# Patient Record
Sex: Female | Born: 1940 | Race: White | Hispanic: No | Marital: Married | Smoking: Never smoker
Health system: Southern US, Community
[De-identification: ages and names within clinical notes are randomized; demographics above are authoritative.]

## PROBLEM LIST (undated history)

## (undated) DIAGNOSIS — R51 Headache: Secondary | ICD-10-CM

## (undated) DIAGNOSIS — E7800 Pure hypercholesterolemia, unspecified: Secondary | ICD-10-CM

## (undated) DIAGNOSIS — I2510 Atherosclerotic heart disease of native coronary artery without angina pectoris: Secondary | ICD-10-CM

## (undated) DIAGNOSIS — S42309A Unspecified fracture of shaft of humerus, unspecified arm, initial encounter for closed fracture: Secondary | ICD-10-CM

## (undated) DIAGNOSIS — E039 Hypothyroidism, unspecified: Secondary | ICD-10-CM

## (undated) DIAGNOSIS — G4700 Insomnia, unspecified: Secondary | ICD-10-CM

## (undated) DIAGNOSIS — M1990 Unspecified osteoarthritis, unspecified site: Secondary | ICD-10-CM

## (undated) DIAGNOSIS — I739 Peripheral vascular disease, unspecified: Secondary | ICD-10-CM

## (undated) DIAGNOSIS — J302 Other seasonal allergic rhinitis: Secondary | ICD-10-CM

## (undated) DIAGNOSIS — R519 Headache, unspecified: Secondary | ICD-10-CM

## (undated) HISTORY — PX: COLONOSCOPY W/ BIOPSIES AND POLYPECTOMY: SHX1376

## (undated) HISTORY — PX: CORONARY ANGIOPLASTY WITH STENT PLACEMENT: SHX49

## (undated) HISTORY — PX: JOINT REPLACEMENT: SHX530

## (undated) HISTORY — PX: MULTIPLE TOOTH EXTRACTIONS: SHX2053

## (undated) HISTORY — PX: TUBAL LIGATION: SHX77

## (undated) HISTORY — PX: TONSILLECTOMY: SUR1361

## (undated) HISTORY — PX: FRACTURE SURGERY: SHX138

## (undated) HISTORY — PX: DILATION AND CURETTAGE OF UTERUS: SHX78

## (undated) HISTORY — PX: MEDIAL PARTIAL KNEE REPLACEMENT: SHX5965

## (undated) HISTORY — PX: APPENDECTOMY: SHX54

## (undated) HISTORY — PX: ABDOMINAL HYSTERECTOMY: SHX81

## (undated) HISTORY — PX: CHOLECYSTECTOMY: SHX55

---

## 1998-10-12 ENCOUNTER — Ambulatory Visit (HOSPITAL_COMMUNITY): Admission: RE | Admit: 1998-10-12 | Discharge: 1998-10-12 | Payer: Self-pay

## 2000-10-17 ENCOUNTER — Ambulatory Visit (HOSPITAL_COMMUNITY): Admission: RE | Admit: 2000-10-17 | Discharge: 2000-10-17 | Payer: Self-pay | Admitting: Internal Medicine

## 2002-10-22 ENCOUNTER — Ambulatory Visit (HOSPITAL_COMMUNITY): Admission: RE | Admit: 2002-10-22 | Discharge: 2002-10-22 | Payer: Self-pay | Admitting: *Deleted

## 2015-07-10 ENCOUNTER — Emergency Department (HOSPITAL_COMMUNITY)
Admission: EM | Admit: 2015-07-10 | Discharge: 2015-07-10 | Disposition: A | Discharge status: Home / Self Care | Payer: Medicare Other | Attending: Emergency Medicine | Admitting: Emergency Medicine

## 2015-07-10 ENCOUNTER — Encounter (HOSPITAL_COMMUNITY): Payer: Self-pay | Admitting: Emergency Medicine

## 2015-07-10 ENCOUNTER — Emergency Department (HOSPITAL_COMMUNITY): Payer: Medicare Other

## 2015-07-10 DIAGNOSIS — M19012 Primary osteoarthritis, left shoulder: Secondary | ICD-10-CM | POA: Diagnosis not present

## 2015-07-10 DIAGNOSIS — Z7982 Long term (current) use of aspirin: Secondary | ICD-10-CM | POA: Diagnosis not present

## 2015-07-10 DIAGNOSIS — Z79899 Other long term (current) drug therapy: Secondary | ICD-10-CM | POA: Diagnosis not present

## 2015-07-10 DIAGNOSIS — Z7902 Long term (current) use of antithrombotics/antiplatelets: Secondary | ICD-10-CM | POA: Insufficient documentation

## 2015-07-10 DIAGNOSIS — Y998 Other external cause status: Secondary | ICD-10-CM | POA: Insufficient documentation

## 2015-07-10 DIAGNOSIS — Z9861 Coronary angioplasty status: Secondary | ICD-10-CM | POA: Diagnosis not present

## 2015-07-10 DIAGNOSIS — Y92014 Private driveway to single-family (private) house as the place of occurrence of the external cause: Secondary | ICD-10-CM | POA: Insufficient documentation

## 2015-07-10 DIAGNOSIS — W010XXA Fall on same level from slipping, tripping and stumbling without subsequent striking against object, initial encounter: Secondary | ICD-10-CM | POA: Diagnosis not present

## 2015-07-10 DIAGNOSIS — S42202A Unspecified fracture of upper end of left humerus, initial encounter for closed fracture: Secondary | ICD-10-CM

## 2015-07-10 DIAGNOSIS — Y9389 Activity, other specified: Secondary | ICD-10-CM | POA: Diagnosis not present

## 2015-07-10 DIAGNOSIS — S42292A Other displaced fracture of upper end of left humerus, initial encounter for closed fracture: Secondary | ICD-10-CM | POA: Diagnosis not present

## 2015-07-10 DIAGNOSIS — S4992XA Unspecified injury of left shoulder and upper arm, initial encounter: Secondary | ICD-10-CM | POA: Diagnosis present

## 2015-07-10 MED ORDER — OXYCODONE-ACETAMINOPHEN 5-325 MG PO TABS
1.0000 | ORAL_TABLET | Freq: Once | ORAL | Status: AC
  Administered 2015-07-10: 1 via ORAL
  Filled 2015-07-10: qty 1

## 2015-07-10 MED ORDER — OXYCODONE-ACETAMINOPHEN 5-325 MG PO TABS: 1.0000 | ORAL_TABLET | Freq: Four times a day (QID) | ORAL | Status: AC | PRN

## 2015-07-10 NOTE — Discharge Instructions (Signed)
Please read and follow all provided instructions.  Your diagnoses today include:  1. Proximal humerus fracture, left, closed, initial encounter     Tests performed today include:  An x-ray of the affected area - shows proximal humerus fracture  Vital signs. See below for your results today.   Medications prescribed:   Percocet (oxycodone/acetaminophen) - narcotic pain medication  DO NOT drive or perform any activities that require you to be awake and alert because this medicine can make you drowsy. BE VERY CAREFUL not to take multiple medicines containing Tylenol (also called acetaminophen). Doing so can lead to an overdose which can damage your liver and cause liver failure and possibly death.  Take any prescribed medications only as directed.  Home care instructions:   Follow any educational materials contained in this packet  Follow R.I.C.E. Protocol:  R - rest your injury   I  - use ice on injury without applying directly to skin  C - compress injury with bandage or splint  E - elevate the injury as much as possible  Follow-up instructions: [facility] Monday morning between 8 AM and 10 AM and Dr. Darrelyn HillockGioffre will see you.  Return instructions:   Please return if your fingers are numb or tingling, appear gray or blue, or you have severe pain (also elevate the arm and loosen splint or wrap if you were given one)  Please return to the Emergency Department if you experience worsening symptoms.   Please return if you have any other emergent concerns.  Additional Information:  Your vital signs today were: BP 104/73 mmHg   Pulse 66   Temp(Src) 98.1 F (36.7 C) (Oral)   Resp 16   SpO2 99% If your blood pressure (BP) was elevated above 135/85 this visit, please have this repeated by your doctor within one month. --------------

## 2015-07-10 NOTE — ED Notes (Signed)
Pa  at bedside. 

## 2015-07-10 NOTE — ED Notes (Signed)
Pt fell yesterday and fell on L arm that is scheduled to have shoulder surgery. No previous breaks.

## 2015-07-10 NOTE — ED Provider Notes (Signed)
CSN: [id number]     Arrival date & time 07/10/15  0912 History   First MD Initiated Contact with Patient 07/10/15 [phone]     Chief Complaint  Patient presents with  . 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 Ranell PatrickNorris -- presents with complaint of acute onset left shoulder pain after falling while at [employer] today. Patient states she reached for her shopping cart and fell forward onto her left arm. Pain is worse with movement and palpation. She did not hurt her head or neck. No treatments prior to arrival. Patient caught her orthopedist and was directed to go to the emergency department for x-rays.  Patient is a 75 y.o. female presenting with arm injury. The history is provided by the patient.  Arm Injury Associated symptoms: no back pain and no neck pain     History reviewed. No pertinent past medical history. Past Surgical History  Procedure Laterality Date  . Coronary angioplasty with stent placement     History reviewed. No pertinent family history. Social History  Substance Use Topics  . Smoking status: Never Smoker   . Smokeless tobacco: None  . Alcohol Use: No   OB History    No data available     Review of Systems  Constitutional: Negative for activity change.  Musculoskeletal: Positive for myalgias and arthralgias. Negative for back pain, joint swelling and neck pain.  Skin: Negative for wound.  Neurological: Negative for weakness and numbness.      Allergies  Review of patient's allergies indicates no known allergies.  Home Medications   Prior to Admission medications   Medication Sig Start Date End Date Taking? Authorizing Provider  aspirin EC 81 MG tablet Take 81 mg by mouth daily before lunch.   Yes Historical Provider, MD  atorvastatin (LIPITOR) 40 MG tablet Take 40 mg by mouth at  bedtime.   Yes Historical Provider, MD  cetirizine (ZYRTEC) 10 MG tablet Take 10 mg by mouth daily. 01/10/10  Yes Historical Provider, MD  clopidogrel (PLAVIX) 75 MG tablet Take 75 mg by mouth daily. 01/10/10  Yes Historical Provider, MD  conjugated estrogens (PREMARIN) vaginal cream Place 1 Applicatorful vaginally 3 (three) times a week.   Yes Historical Provider, MD  cycloSPORINE (RESTASIS) 0.05 % ophthalmic emulsion Place 1 drop into both eyes 2 (two) times daily.   Yes Historical Provider, MD  Docusate Sodium (STOOL SOFTENER) 100 MG capsule Take 100 mg by mouth 2 (two) times daily. 01/11/10  Yes Historical Provider, MD  DULoxetine (CYMBALTA) 60 MG capsule Take 60 mg by mouth every morning. 01/10/10  Yes Historical Provider, MD  furosemide (LASIX) 20 MG tablet Take 20 mg by mouth daily as needed for fluid.    Yes Historical Provider, MD  gabapentin (NEURONTIN) 300 MG capsule Take 300 mg by mouth at bedtime. 01/10/10  Yes Historical Provider, MD  isosorbide mononitrate (IMDUR) 30 MG 24 hr tablet Take 30 mg by mouth daily. 01/10/10  Yes Historical Provider, MD  levothyroxine (SYNTHROID) 75 MCG tablet Take 75 mcg by mouth daily before breakfast.  08/11/10  Yes Historical Provider, MD  lisinopril (PRINIVIL,ZESTRIL) 5 MG tablet Take 5 mg by mouth daily. 04/17/15  Yes Historical Provider, MD  metoprolol (LOPRESSOR) 50 MG tablet Take 50 mg by mouth 2 (two) times daily. 04/17/15  Yes Historical Provider, MD  mometasone (NASONEX) 50 MCG/ACT nasal spray Place 1  spray into the nose daily as needed (allergies.).    Yes Historical Provider, MD  Multiple Vitamin (MULTIVITAMIN) tablet Take 1 tablet by mouth daily. 01/10/10  Yes Historical Provider, MD  naproxen sodium (RA NAPROXEN SODIUM) 220 MG tablet Take 220-440 mg by mouth daily as needed (pain.).  01/10/10  Yes Historical Provider, MD  nitroGLYCERIN (NITROSTAT) 0.4 MG SL tablet Place 1 tablet under the tongue every 5 (five) minutes x 3 doses as needed for  chest pain.  01/28/15  Yes Historical Provider, MD  pramipexole (MIRAPEX) 0.5 MG tablet Take 1 tablet by mouth at bedtime. 04/19/15  Yes Historical Provider, MD  RANEXA 500 MG 12 hr tablet Take 500 mg by mouth 2 (two) times daily. 04/17/15  Yes Historical Provider, MD  rosuvastatin (CRESTOR) 40 MG tablet Take 40 mg by mouth daily.   Yes Historical Provider, MD  traMADol (ULTRAM) 50 MG tablet Take 50 mg by mouth 3 (three) times daily as needed for moderate pain.    Yes Historical Provider, MD  zolpidem (AMBIEN) 5 MG tablet Take 5 mg by mouth at bedtime as needed for sleep.  04/17/15  Yes Historical Provider, MD   BP 104/73 mmHg  Pulse 66  Temp(Src) 98.1 F (36.7 C) (Oral)  Resp 16  SpO2 99% Physical Exam  Constitutional: She appears well-developed and well-nourished.  HENT:  Head: Normocephalic and atraumatic.  Eyes: Conjunctivae are normal. Pupils are equal, round, and reactive to light.  Neck: Normal range of motion. Neck supple.  Cardiovascular: Exam reveals no decreased pulses.   Pulses:      Radial pulses are 2+ on the right side, and 2+ on the left side.  Pulmonary/Chest: No respiratory distress.  Musculoskeletal: She exhibits tenderness. She exhibits no edema.       Left shoulder: She exhibits decreased range of motion, tenderness and bony tenderness. She exhibits no swelling.       Left elbow: Normal.       Left wrist: Normal.       Cervical back: Normal.       Left upper arm: She exhibits tenderness. She exhibits no bony tenderness and no swelling.       Left forearm: Normal.       Left hand: Normal.  Neurological: She is alert. No sensory deficit.  Motor, sensation, and vascular distal to the injury is fully intact.   Skin: Skin is warm and dry.  Psychiatric: She has a normal mood and affect.  Nursing note and vitals reviewed.   ED Course  Procedures (including critical care time) Labs Review Labs Reviewed - No data to display  Imaging Review Dg Elbow Complete  Left  07/10/2015  CLINICAL DATA:  Pt c/o shoulder pain s/p fall yesterday evening in her driveway at home, slipped, fall. Pt denies elbow pain, best images obtainable due to pt's pain. Humerus images obtained concurrently, with ap elbow displayed. EXAM: LEFT ELBOW - COMPLETE 3+ VIEW COMPARISON:  None. FINDINGS: Nonstandard positioning. There is no evidence of fracture, dislocation, or joint effusion. There is no evidence of arthropathy or other focal bone abnormality. Soft tissues are unremarkable. IMPRESSION: Negative. Electronically Signed   By: Corlis Leak M.D.   On: 07/10/2015 10:13   Dg Shoulder Left  07/10/2015  CLINICAL DATA:  Pain after fall EXAM: LEFT SHOULDER - 2+ VIEW COMPARISON:  None. FINDINGS: There is a comminuted fracture of the left humeral head and neck. The lack of an axillary or transscapular view limits evaluation for dislocation  but none is seen. Degenerative changes seen in the shoulder. No other acute abnormalities. IMPRESSION: Comminuted fracture of the left humeral head and neck. Evaluation for dislocation is limited but none is seen. Electronically Signed   By: Gerome Sam III M.D   On: 07/10/2015 10:11   Dg Humerus Left  07/10/2015  CLINICAL DATA:  Pain after fall EXAM: LEFT HUMERUS - 2+ VIEW COMPARISON:  None. FINDINGS: The humeral head and neck fracture is again identified. The remainder of the humerus is intact. The proximal radius and ulna are remarkable on limited views. IMPRESSION: Right humeral head and neck fracture.  No other abnormalities. Electronically Signed   By: Gerome Sam III M.D   On: 07/10/2015 10:12   I have personally reviewed and evaluated these images and lab results as part of my medical decision-making.   EKG Interpretation None      10:34 AM Patient seen and examined. Patient and husband updated on x-ray results. Discussed with Dr. Deretha Emory will see patient. Will touch base with patient's orthopedist as courtesy. Plan sling and pain control.    Vital signs reviewed and are as follows: BP 104/73 mmHg  Pulse 66  Temp(Src) 98.1 F (36.7 C) (Oral)  Resp 16  SpO2 99%   11:18 AM Spoke with Dr. Darrelyn Hillock by telephone. He advises sling and pain control. He will see patient in the office on Monday morning between 8 and 10 AM.  Patient and husband updated on plan. Patient is dressed with sling in place. Ready for discharge at this time.  Discussed to use pain medication only under direct supervision at the lowest possible dose needed to control your pain.    MDM   Final diagnoses:  Proximal humerus fracture, left, closed, initial encounter   Patient with proximal humerus fracture. Distal motor, sensation, and pulses intact. Orthopedic follow-up obtained. No suspected head or neck injury from the fall. No wrist injury.   Renne Crigler, PA-C 07/10/15 1119

## 2015-07-10 NOTE — ED Notes (Signed)
Patient transported to X-ray 

## 2015-07-10 NOTE — ED Provider Notes (Signed)
Medical screening examination/treatment/procedure(s) were conducted as a shared visit with non-physician practitioner(s) and myself.  I personally evaluated the patient during the encounter.   EKG Interpretation None       No results found for this or any previous visit. Dg Elbow Complete Left  07/10/2015  CLINICAL DATA:  Pt c/o shoulder pain s/p fall yesterday evening in her driveway at home, slipped, fall. Pt denies elbow pain, best images obtainable due to pt's pain. Humerus images obtained concurrently, with ap elbow displayed. EXAM: LEFT ELBOW - COMPLETE 3+ VIEW COMPARISON:  None. FINDINGS: Nonstandard positioning. There is no evidence of fracture, dislocation, or joint effusion. There is no evidence of arthropathy or other focal bone abnormality. Soft tissues are unremarkable. IMPRESSION: Negative. Electronically Signed   By: Corlis Leak  Hassell M.D.   On: 07/10/2015 10:13   Dg Shoulder Left  07/10/2015  CLINICAL DATA:  Pain after fall EXAM: LEFT SHOULDER - 2+ VIEW COMPARISON:  None. FINDINGS: There is a comminuted fracture of the left humeral head and neck. The lack of an axillary or transscapular view limits evaluation for dislocation but none is seen. Degenerative changes seen in the shoulder. No other acute abnormalities. IMPRESSION: Comminuted fracture of the left humeral head and neck. Evaluation for dislocation is limited but none is seen. Electronically Signed   By: Gerome Samavid  Williams III M.D   On: 07/10/2015 10:11   Dg Humerus Left  07/10/2015  CLINICAL DATA:  Pain after fall EXAM: LEFT HUMERUS - 2+ VIEW COMPARISON:  None. FINDINGS: The humeral head and neck fracture is again identified. The remainder of the humerus is intact. The proximal radius and ulna are remarkable on limited views. IMPRESSION: Right humeral head and neck fracture.  No other abnormalities. Electronically Signed   By: Gerome Samavid  Williams III M.D   On: 07/10/2015 10:12    Patient seen by me. Patient status post a fall yesterday at  the [employer] parking lot landing on her left arm. X-ray show evidence of a proximal comminuted humerus fracture. Patient's sensation distally is intact good range of motion of the fig fingers. Cap refill is 2 seconds. Patient treated with sling and does have orthopedics of for follow-up. Patient was actually scheduled in May to have a left shoulder replacement. Orthopedic service will be contacted to make them aware of the injury and patient can be discharged. No other injuries with the fall.  Vanetta MuldersScott George Alcantar, MD 07/10/15 1114

## 2015-07-21 ENCOUNTER — Encounter (HOSPITAL_COMMUNITY): Payer: Self-pay | Admitting: *Deleted

## 2015-07-21 NOTE — Progress Notes (Signed)
Pt denies SOB and chest pain. Pt is under the care of Dr. Wille GlaserWallmeyer, Cardiology. Pt stated that her last dose of Aspirin and Plavix was Friday. Pt made aware to stop vitamins, fish oil, herbal medications and NSAID's. Requested EKG tracing, echo, stress, cath report, LOV notes and cardiac clearance note from Dr. Wille GlaserWallmeyer. Pt verbalized understanding of all pre-op instructions. Anesthesia to review pt cardiac history.

## 2015-07-22 NOTE — Progress Notes (Signed)
Anesthesia Chart Review:  Pt is a 75 year old female scheduled for L reverse total shoulder arthroplasty, os acromiale repair, ORIF L shoulder on 07/23/2015 with Dr. Ranell PatrickNorris.   Pt is a same day work up.   Cardiologist is Dr. Bonnielee HaffKenneth Wallmeyer (care everywhere) who has cleared pt for surgery. PCP is Dr. Brooke BonitoWarren Gallemore.   PMH includes:  CAD (by notes, stenting to LAD and ostial RCA 2012; cath 2013 LAD widely patent, flush occlusion RCA not amenable to revascularization), hyperlipidemia, PVD, hypothyroidism. Never smoker.  Medications include: ASA, lipitor, plavix, lasix, imdur, levothyroxine, lisinopril, metoprolol, ranexa, crestor.  Pt to stop ASA and plavix 5 days before surgery.   Labs will be obtained DOS  EKG 06/29/15: sinus bradycardia (57 bpm). Low voltage to precordial leads.       If labs acceptable DOS, I anticipate pt can proceed as scheduled.   Rica Mastngela Katheryn Culliton, FNP-BC [facility]An[facility]iology Phone: [phone] 07/22/2015 1:14 PM

## 2015-07-23 ENCOUNTER — Inpatient Hospital Stay (HOSPITAL_COMMUNITY): Payer: Medicare Other

## 2015-07-23 ENCOUNTER — Encounter (HOSPITAL_COMMUNITY): Payer: Self-pay | Admitting: Anesthesiology

## 2015-07-23 ENCOUNTER — Inpatient Hospital Stay (HOSPITAL_COMMUNITY): Payer: Medicare Other | Admitting: Emergency Medicine

## 2015-07-23 ENCOUNTER — Inpatient Hospital Stay (HOSPITAL_COMMUNITY)
Admission: RE | Admit: 2015-07-23 | Discharge: 2015-07-25 | DRG: 483 | Disposition: A | Discharge status: Home Health | Payer: Medicare Other | Source: Ambulatory Visit | Attending: Orthopedic Surgery | Admitting: Orthopedic Surgery

## 2015-07-23 ENCOUNTER — Encounter (HOSPITAL_COMMUNITY)
Admission: RE | Disposition: A | Discharge status: Home Health | Payer: Self-pay | Source: Ambulatory Visit | Attending: Orthopedic Surgery

## 2015-07-23 DIAGNOSIS — Z96653 Presence of artificial knee joint, bilateral: Secondary | ICD-10-CM | POA: Diagnosis present

## 2015-07-23 DIAGNOSIS — Z96619 Presence of unspecified artificial shoulder joint: Secondary | ICD-10-CM

## 2015-07-23 DIAGNOSIS — E039 Hypothyroidism, unspecified: Secondary | ICD-10-CM | POA: Diagnosis present

## 2015-07-23 DIAGNOSIS — I2510 Atherosclerotic heart disease of native coronary artery without angina pectoris: Secondary | ICD-10-CM | POA: Diagnosis present

## 2015-07-23 DIAGNOSIS — M19012 Primary osteoarthritis, left shoulder: Secondary | ICD-10-CM | POA: Diagnosis present

## 2015-07-23 DIAGNOSIS — M25512 Pain in left shoulder: Secondary | ICD-10-CM | POA: Diagnosis present

## 2015-07-23 DIAGNOSIS — E7800 Pure hypercholesterolemia, unspecified: Secondary | ICD-10-CM | POA: Diagnosis present

## 2015-07-23 DIAGNOSIS — M129 Arthropathy, unspecified: Secondary | ICD-10-CM | POA: Diagnosis present

## 2015-07-23 DIAGNOSIS — Z955 Presence of coronary angioplasty implant and graft: Secondary | ICD-10-CM | POA: Diagnosis not present

## 2015-07-23 DIAGNOSIS — I739 Peripheral vascular disease, unspecified: Secondary | ICD-10-CM | POA: Diagnosis present

## 2015-07-23 DIAGNOSIS — S42292A Other displaced fracture of upper end of left humerus, initial encounter for closed fracture: Secondary | ICD-10-CM | POA: Diagnosis present

## 2015-07-23 DIAGNOSIS — W010XXA Fall on same level from slipping, tripping and stumbling without subsequent striking against object, initial encounter: Secondary | ICD-10-CM | POA: Diagnosis present

## 2015-07-23 DIAGNOSIS — G4700 Insomnia, unspecified: Secondary | ICD-10-CM | POA: Diagnosis present

## 2015-07-23 DIAGNOSIS — Z96612 Presence of left artificial shoulder joint: Secondary | ICD-10-CM

## 2015-07-23 HISTORY — PX: REVERSE SHOULDER ARTHROPLASTY: SHX5054

## 2015-07-23 HISTORY — DX: Unspecified fracture of shaft of humerus, unspecified arm, initial encounter for closed fracture: S42.309A

## 2015-07-23 HISTORY — DX: Pure hypercholesterolemia, unspecified: E78.00

## 2015-07-23 HISTORY — DX: Atherosclerotic heart disease of native coronary artery without angina pectoris: I25.10

## 2015-07-23 HISTORY — DX: Headache, unspecified: R51.9

## 2015-07-23 HISTORY — DX: Hypothyroidism, unspecified: E03.9

## 2015-07-23 HISTORY — DX: Unspecified osteoarthritis, unspecified site: M19.90

## 2015-07-23 HISTORY — DX: Other seasonal allergic rhinitis: J30.2

## 2015-07-23 HISTORY — DX: Peripheral vascular disease, unspecified: I73.9

## 2015-07-23 HISTORY — DX: Insomnia, unspecified: G47.00

## 2015-07-23 HISTORY — DX: Headache: R51

## 2015-07-23 LAB — BASIC METABOLIC PANEL
Anion gap: 12 (ref 5–15)
BUN: 16 mg/dL (ref 6–20)
CHLORIDE: 103 mmol/L (ref 101–111)
CO2: 23 mmol/L (ref 22–32)
CREATININE: 0.92 mg/dL (ref 0.44–1.00)
Calcium: 9.5 mg/dL (ref 8.9–10.3)
GFR calc Af Amer: >60 mL/min (ref >60)
GFR calc non Af Amer: >60 mL/min (ref >60)
GLUCOSE: 92 mg/dL (ref 65–99)
Potassium: 4.3 mmol/L (ref 3.5–5.1)
SODIUM: 138 mmol/L (ref 135–145)

## 2015-07-23 LAB — CBC
HCT: 37.4 % (ref 36.0–46.0)
Hemoglobin: 12.2 g/dL (ref 12.0–15.0)
MCH: 31 pg (ref 26.0–34.0)
MCHC: 32.6 g/dL (ref 30.0–36.0)
MCV: 94.9 fL (ref 78.0–100.0)
PLATELETS: 387 10*3/uL (ref 150–400)
RBC: 3.94 MIL/uL (ref 3.87–5.11)
RDW: 13.4 % (ref 11.5–15.5)
WBC: 6.1 10*3/uL (ref 4.0–10.5)

## 2015-07-23 SURGERY — ARTHROPLASTY, SHOULDER, TOTAL, REVERSE
Anesthesia: General | Site: Shoulder | Laterality: Left

## 2015-07-23 MED ORDER — OXYCODONE-ACETAMINOPHEN 5-325 MG PO TABS: 1.0000 | ORAL_TABLET | ORAL | Status: AC | PRN

## 2015-07-23 MED ORDER — DOCUSATE SODIUM 100 MG PO CAPS
100.0000 mg | ORAL_CAPSULE | Freq: Two times a day (BID) | ORAL | Status: DC
  Administered 2015-07-23 – 2015-07-25 (×4): 100 mg via ORAL
  Filled 2015-07-23 (×4): qty 1

## 2015-07-23 MED ORDER — SUCCINYLCHOLINE CHLORIDE 20 MG/ML IJ SOLN
INTRAMUSCULAR | Status: DC | PRN
  Administered 2015-07-23: 100 mg via INTRAVENOUS

## 2015-07-23 MED ORDER — SODIUM CHLORIDE 0.9 % IR SOLN
Status: DC | PRN
Start: 2015-07-23 — End: 2015-07-23
  Administered 2015-07-23: 1000 mL

## 2015-07-23 MED ORDER — PROPOFOL 10 MG/ML IV BOLUS
INTRAVENOUS | Status: DC | PRN
  Administered 2015-07-23: 150 mg via INTRAVENOUS
  Administered 2015-07-23: 20 mg via INTRAVENOUS

## 2015-07-23 MED ORDER — ASPIRIN EC 81 MG PO TBEC
81.0000 mg | DELAYED_RELEASE_TABLET | Freq: Every day | ORAL | Status: DC
  Administered 2015-07-24 – 2015-07-25 (×2): 81 mg via ORAL
  Filled 2015-07-23: qty 1

## 2015-07-23 MED ORDER — METOCLOPRAMIDE HCL 5 MG/ML IJ SOLN: 5.0000 mg | Freq: Three times a day (TID) | INTRAMUSCULAR | Status: DC | PRN

## 2015-07-23 MED ORDER — CHLORHEXIDINE GLUCONATE 4 % EX LIQD: 60.0000 mL | Freq: Once | CUTANEOUS | Status: DC

## 2015-07-23 MED ORDER — ACETAMINOPHEN 325 MG PO TABS: 650.0000 mg | ORAL_TABLET | Freq: Four times a day (QID) | ORAL | Status: DC | PRN

## 2015-07-23 MED ORDER — NITROGLYCERIN 0.4 MG SL SUBL: 0.4000 mg | SUBLINGUAL_TABLET | SUBLINGUAL | Status: DC | PRN

## 2015-07-23 MED ORDER — LEVOTHYROXINE SODIUM 75 MCG PO TABS
75.0000 ug | ORAL_TABLET | Freq: Every day | ORAL | Status: DC
  Administered 2015-07-24 – 2015-07-25 (×2): 75 ug via ORAL
  Filled 2015-07-23 (×2): qty 1

## 2015-07-23 MED ORDER — FENTANYL CITRATE (PF) 100 MCG/2ML IJ SOLN
INTRAMUSCULAR | Status: AC
  Administered 2015-07-23: 50 ug
  Filled 2015-07-23: qty 2

## 2015-07-23 MED ORDER — ATORVASTATIN CALCIUM 40 MG PO TABS
40.0000 mg | ORAL_TABLET | Freq: Every day | ORAL | Status: DC
  Administered 2015-07-23 – 2015-07-24 (×2): 40 mg via ORAL
  Filled 2015-07-23 (×2): qty 1

## 2015-07-23 MED ORDER — ACETAMINOPHEN 650 MG RE SUPP: 650.0000 mg | Freq: Four times a day (QID) | RECTAL | Status: DC | PRN

## 2015-07-23 MED ORDER — FUROSEMIDE 20 MG PO TABS: 20.0000 mg | ORAL_TABLET | Freq: Every day | ORAL | Status: DC | PRN

## 2015-07-23 MED ORDER — SODIUM CHLORIDE 0.9 % IV SOLN
INTRAVENOUS | Status: DC
  Administered 2015-07-23: 22:00:00 via INTRAVENOUS

## 2015-07-23 MED ORDER — METHOCARBAMOL 500 MG PO TABS
500.0000 mg | ORAL_TABLET | Freq: Four times a day (QID) | ORAL | Status: DC | PRN
  Administered 2015-07-24 – 2015-07-25 (×3): 500 mg via ORAL
  Filled 2015-07-23 (×4): qty 1

## 2015-07-23 MED ORDER — DULOXETINE HCL 60 MG PO CPEP
60.0000 mg | ORAL_CAPSULE | Freq: Every morning | ORAL | Status: DC
  Administered 2015-07-24 – 2015-07-25 (×2): 60 mg via ORAL
  Filled 2015-07-23 (×2): qty 1

## 2015-07-23 MED ORDER — PRAMIPEXOLE DIHYDROCHLORIDE 0.125 MG PO TABS
0.5000 mg | ORAL_TABLET | Freq: Every day | ORAL | Status: DC
  Administered 2015-07-23 – 2015-07-24 (×2): 0.5 mg via ORAL
  Filled 2015-07-23 (×2): qty 4

## 2015-07-23 MED ORDER — FLUTICASONE PROPIONATE 50 MCG/ACT NA SUSP
2.0000 | Freq: Every day | NASAL | Status: DC
  Administered 2015-07-24 – 2015-07-25 (×2): 2 via NASAL
  Filled 2015-07-23: qty 16

## 2015-07-23 MED ORDER — DOCUSATE SODIUM 100 MG PO TABS: 100.0000 mg | ORAL_TABLET | Freq: Two times a day (BID) | ORAL | Status: DC

## 2015-07-23 MED ORDER — PHENYLEPHRINE HCL 10 MG/ML IJ SOLN
INTRAMUSCULAR | Status: DC | PRN
  Administered 2015-07-23 (×3): 40 ug via INTRAVENOUS
  Administered 2015-07-23: 80 ug via INTRAVENOUS

## 2015-07-23 MED ORDER — METOPROLOL TARTRATE 50 MG PO TABS
50.0000 mg | ORAL_TABLET | Freq: Two times a day (BID) | ORAL | Status: DC
  Administered 2015-07-23 – 2015-07-25 (×3): 50 mg via ORAL
  Filled 2015-07-23 (×4): qty 1

## 2015-07-23 MED ORDER — CEFAZOLIN SODIUM-DEXTROSE 2-4 GM/100ML-% IV SOLN
2.0000 g | INTRAVENOUS | Status: AC
  Administered 2015-07-23: 2 g via INTRAVENOUS
  Filled 2015-07-23: qty 100

## 2015-07-23 MED ORDER — BISACODYL 10 MG RE SUPP: 10.0000 mg | Freq: Every day | RECTAL | Status: DC | PRN

## 2015-07-23 MED ORDER — LORATADINE 10 MG PO TABS
10.0000 mg | ORAL_TABLET | Freq: Every day | ORAL | Status: DC
  Administered 2015-07-24 – 2015-07-25 (×2): 10 mg via ORAL
  Filled 2015-07-23 (×2): qty 1

## 2015-07-23 MED ORDER — RANOLAZINE ER 500 MG PO TB12
500.0000 mg | ORAL_TABLET | Freq: Two times a day (BID) | ORAL | Status: DC
  Administered 2015-07-23 – 2015-07-25 (×4): 500 mg via ORAL
  Filled 2015-07-23 (×4): qty 1

## 2015-07-23 MED ORDER — LACTATED RINGERS IV SOLN
INTRAVENOUS | Status: DC
  Administered 2015-07-23 (×2): via INTRAVENOUS

## 2015-07-23 MED ORDER — ESTROGENS, CONJUGATED 0.625 MG/GM VA CREA: 1.0000 | TOPICAL_CREAM | VAGINAL | Status: DC

## 2015-07-23 MED ORDER — MIDAZOLAM HCL 2 MG/2ML IJ SOLN
INTRAMUSCULAR | Status: AC
  Administered 2015-07-23: 1 mg
  Filled 2015-07-23: qty 2

## 2015-07-23 MED ORDER — METHOCARBAMOL 500 MG PO TABS: 500.0000 mg | ORAL_TABLET | Freq: Three times a day (TID) | ORAL | Status: AC | PRN

## 2015-07-23 MED ORDER — CEFAZOLIN SODIUM-DEXTROSE 2-4 GM/100ML-% IV SOLN
2.0000 g | Freq: Four times a day (QID) | INTRAVENOUS | Status: AC
  Administered 2015-07-23 – 2015-07-24 (×3): 2 g via INTRAVENOUS
  Filled 2015-07-23 (×3): qty 100

## 2015-07-23 MED ORDER — FENTANYL CITRATE (PF) 100 MCG/2ML IJ SOLN
INTRAMUSCULAR | Status: DC | PRN
  Administered 2015-07-23: 100 ug via INTRAVENOUS

## 2015-07-23 MED ORDER — LIDOCAINE HCL (CARDIAC) 20 MG/ML IV SOLN
INTRAVENOUS | Status: DC | PRN
  Administered 2015-07-23: 100 mg via INTRAVENOUS

## 2015-07-23 MED ORDER — LISINOPRIL 5 MG PO TABS
5.0000 mg | ORAL_TABLET | Freq: Every day | ORAL | Status: DC
  Administered 2015-07-24 – 2015-07-25 (×2): 5 mg via ORAL
  Filled 2015-07-23 (×2): qty 1

## 2015-07-23 MED ORDER — ONDANSETRON HCL 4 MG/2ML IJ SOLN
INTRAMUSCULAR | Status: DC | PRN
Start: 2015-07-23 — End: 2015-07-23
  Administered 2015-07-23: 4 mg via INTRAVENOUS

## 2015-07-23 MED ORDER — EPHEDRINE SULFATE 50 MG/ML IJ SOLN
INTRAMUSCULAR | Status: DC | PRN
  Administered 2015-07-23: 5 mg via INTRAVENOUS
  Administered 2015-07-23: 10 mg via INTRAVENOUS
  Administered 2015-07-23 (×3): 5 mg via INTRAVENOUS

## 2015-07-23 MED ORDER — METOCLOPRAMIDE HCL 5 MG PO TABS: 5.0000 mg | ORAL_TABLET | Freq: Three times a day (TID) | ORAL | Status: DC | PRN

## 2015-07-23 MED ORDER — MORPHINE SULFATE (PF) 2 MG/ML IV SOLN
2.0000 mg | INTRAVENOUS | Status: DC | PRN
  Administered 2015-07-24 (×3): 2 mg via INTRAVENOUS
  Filled 2015-07-23 (×3): qty 1

## 2015-07-23 MED ORDER — POLYETHYLENE GLYCOL 3350 17 G PO PACK: 17.0000 g | PACK | Freq: Every day | ORAL | Status: DC | PRN

## 2015-07-23 MED ORDER — GABAPENTIN 300 MG PO CAPS
300.0000 mg | ORAL_CAPSULE | Freq: Every day | ORAL | Status: DC
  Administered 2015-07-23 – 2015-07-24 (×2): 300 mg via ORAL
  Filled 2015-07-23 (×2): qty 1

## 2015-07-23 MED ORDER — METOPROLOL TARTRATE 50 MG PO TABS
ORAL_TABLET | ORAL | Status: AC
  Filled 2015-07-23: qty 1

## 2015-07-23 MED ORDER — METOPROLOL TARTRATE 50 MG PO TABS
50.0000 mg | ORAL_TABLET | Freq: Once | ORAL | Status: AC
  Administered 2015-07-23: 50 mg via ORAL

## 2015-07-23 MED ORDER — ONDANSETRON HCL 4 MG PO TABS: 4.0000 mg | ORAL_TABLET | Freq: Four times a day (QID) | ORAL | Status: DC | PRN

## 2015-07-23 MED ORDER — FENTANYL CITRATE (PF) 250 MCG/5ML IJ SOLN
INTRAMUSCULAR | Status: AC
  Filled 2015-07-23: qty 5

## 2015-07-23 MED ORDER — CYCLOSPORINE 0.05 % OP EMUL
1.0000 [drp] | Freq: Two times a day (BID) | OPHTHALMIC | Status: DC
  Administered 2015-07-24 – 2015-07-25 (×3): 1 [drp] via OPHTHALMIC
  Filled 2015-07-23 (×4): qty 1

## 2015-07-23 MED ORDER — OXYCODONE-ACETAMINOPHEN 5-325 MG PO TABS
1.0000 | ORAL_TABLET | Freq: Four times a day (QID) | ORAL | Status: DC | PRN
  Administered 2015-07-23 – 2015-07-25 (×6): 1 via ORAL
  Filled 2015-07-23 (×6): qty 1

## 2015-07-23 MED ORDER — PHENYLEPHRINE HCL 10 MG/ML IJ SOLN
10.0000 mg | INTRAVENOUS | Status: DC | PRN
  Administered 2015-07-23: 5 ug/min via INTRAVENOUS
  Administered 2015-07-23: 20 ug/min via INTRAVENOUS

## 2015-07-23 MED ORDER — GLYCOPYRROLATE 0.2 MG/ML IJ SOLN
INTRAMUSCULAR | Status: DC | PRN
  Administered 2015-07-23 (×2): .1 mg via INTRAVENOUS

## 2015-07-23 MED ORDER — TRAMADOL HCL 50 MG PO TABS
50.0000 mg | ORAL_TABLET | Freq: Three times a day (TID) | ORAL | Status: DC | PRN
  Administered 2015-07-24 (×2): 50 mg via ORAL
  Filled 2015-07-23 (×2): qty 1

## 2015-07-23 MED ORDER — BUPIVACAINE-EPINEPHRINE (PF) 0.5% -1:200000 IJ SOLN
INTRAMUSCULAR | Status: DC | PRN
  Administered 2015-07-23: 30 mL via PERINEURAL

## 2015-07-23 MED ORDER — ADULT MULTIVITAMIN W/MINERALS CH
1.0000 | ORAL_TABLET | Freq: Every day | ORAL | Status: DC
  Administered 2015-07-24 – 2015-07-25 (×2): 1 via ORAL
  Filled 2015-07-23 (×2): qty 1

## 2015-07-23 MED ORDER — BUPIVACAINE-EPINEPHRINE (PF) 0.25% -1:200000 IJ SOLN
INTRAMUSCULAR | Status: AC
  Filled 2015-07-23: qty 30

## 2015-07-23 MED ORDER — PHENOL 1.4 % MT LIQD: 1.0000 | OROMUCOSAL | Status: DC | PRN

## 2015-07-23 MED ORDER — ROSUVASTATIN CALCIUM 10 MG PO TABS: 40.0000 mg | ORAL_TABLET | Freq: Every day | ORAL | Status: DC

## 2015-07-23 MED ORDER — METHOCARBAMOL 1000 MG/10ML IJ SOLN
500.0000 mg | Freq: Four times a day (QID) | INTRAMUSCULAR | Status: DC | PRN
  Filled 2015-07-23: qty 5

## 2015-07-23 MED ORDER — ISOSORBIDE MONONITRATE ER 30 MG PO TB24
30.0000 mg | ORAL_TABLET | Freq: Every day | ORAL | Status: DC
  Administered 2015-07-24 – 2015-07-25 (×2): 30 mg via ORAL
  Filled 2015-07-23 (×2): qty 1

## 2015-07-23 MED ORDER — ONDANSETRON HCL 4 MG/2ML IJ SOLN
4.0000 mg | Freq: Four times a day (QID) | INTRAMUSCULAR | Status: DC | PRN
  Administered 2015-07-24: 4 mg via INTRAVENOUS
  Filled 2015-07-23: qty 2

## 2015-07-23 MED ORDER — CLOPIDOGREL BISULFATE 75 MG PO TABS
75.0000 mg | ORAL_TABLET | Freq: Every day | ORAL | Status: DC
  Administered 2015-07-24 – 2015-07-25 (×2): 75 mg via ORAL
  Filled 2015-07-23 (×2): qty 1

## 2015-07-23 MED ORDER — MENTHOL 3 MG MT LOZG: 1.0000 | LOZENGE | OROMUCOSAL | Status: DC | PRN

## 2015-07-23 MED ORDER — BUPIVACAINE-EPINEPHRINE 0.25% -1:200000 IJ SOLN
INTRAMUSCULAR | Status: DC | PRN
  Administered 2015-07-23: 9 mL

## 2015-07-23 SURGICAL SUPPLY — 63 items
BIT DRILL 5/64X5 DISP (BIT) ×3 IMPLANT
BLADE SAG 18X100X1.27 (BLADE) ×3 IMPLANT
CAPT SHLDR REVTOTAL 1 ×3 IMPLANT
CEMENT BONE DEPUY (Cement) ×3 IMPLANT
CLOSURE WOUND 1/2 X4 (GAUZE/BANDAGES/DRESSINGS) ×1
COVER SURGICAL LIGHT HANDLE (MISCELLANEOUS) ×3 IMPLANT
DRAPE IMP U-DRAPE 54X76 (DRAPES) ×6 IMPLANT
DRAPE INCISE IOBAN 66X45 STRL (DRAPES) ×3 IMPLANT
DRAPE ORTHO SPLIT 77X108 STRL (DRAPES) ×4
DRAPE SURG ORHT 6 SPLT 77X108 (DRAPES) ×2 IMPLANT
DRAPE U-SHAPE 47X51 STRL (DRAPES) ×3 IMPLANT
DRAPE X-RAY CASS 24X20 (DRAPES) IMPLANT
DRSG ADAPTIC 3X8 NADH LF (GAUZE/BANDAGES/DRESSINGS) ×3 IMPLANT
DRSG PAD ABDOMINAL 8X10 ST (GAUZE/BANDAGES/DRESSINGS) ×3 IMPLANT
DURAPREP 26ML APPLICATOR (WOUND CARE) ×3 IMPLANT
ELECT BLADE 4.0 EZ CLEAN MEGAD (MISCELLANEOUS) ×3
ELECT NEEDLE TIP 2.8 STRL (NEEDLE) ×3 IMPLANT
ELECT REM PT RETURN 9FT ADLT (ELECTROSURGICAL) ×3
ELECTRODE BLDE 4.0 EZ CLN MEGD (MISCELLANEOUS) ×1 IMPLANT
ELECTRODE REM PT RTRN 9FT ADLT (ELECTROSURGICAL) ×1 IMPLANT
GAUZE SPONGE 4X4 12PLY STRL (GAUZE/BANDAGES/DRESSINGS) ×3 IMPLANT
GLOVE BIOGEL PI ORTHO PRO 7.5 (GLOVE) ×2
GLOVE BIOGEL PI ORTHO PRO SZ8 (GLOVE) ×2
GLOVE ORTHO TXT STRL SZ7.5 (GLOVE) ×3 IMPLANT
GLOVE PI ORTHO PRO STRL 7.5 (GLOVE) ×1 IMPLANT
GLOVE PI ORTHO PRO STRL SZ8 (GLOVE) ×1 IMPLANT
GLOVE SURG ORTHO 8.5 STRL (GLOVE) ×3 IMPLANT
GOWN STRL REUS W/ TWL LRG LVL3 (GOWN DISPOSABLE) ×2 IMPLANT
GOWN STRL REUS W/ TWL XL LVL3 (GOWN DISPOSABLE) ×2 IMPLANT
GOWN STRL REUS W/TWL LRG LVL3 (GOWN DISPOSABLE) ×4
GOWN STRL REUS W/TWL XL LVL3 (GOWN DISPOSABLE) ×4
HANDPIECE INTERPULSE COAX TIP (DISPOSABLE)
KIT BASIN OR (CUSTOM PROCEDURE TRAY) ×3 IMPLANT
KIT ROOM TURNOVER OR (KITS) ×3 IMPLANT
MANIFOLD NEPTUNE II (INSTRUMENTS) ×3 IMPLANT
NEEDLE 1/2 CIR MAYO (NEEDLE) ×3 IMPLANT
NEEDLE HYPO 25GX1X1/2 BEV (NEEDLE) ×3 IMPLANT
NS IRRIG 1000ML POUR BTL (IV SOLUTION) ×3 IMPLANT
PACK SHOULDER (CUSTOM PROCEDURE TRAY) ×3 IMPLANT
PAD ARMBOARD 7.5X6 YLW CONV (MISCELLANEOUS) ×6 IMPLANT
SET HNDPC FAN SPRY TIP SCT (DISPOSABLE) IMPLANT
SLING ARM FOAM STRAP LRG (SOFTGOODS) ×3 IMPLANT
SLING ARM LRG ADULT FOAM STRAP (SOFTGOODS) IMPLANT
SLING ARM MED ADULT FOAM STRAP (SOFTGOODS) IMPLANT
SPONGE GAUZE 4X4 12PLY STER LF (GAUZE/BANDAGES/DRESSINGS) ×3 IMPLANT
SPONGE LAP 18X18 X RAY DECT (DISPOSABLE) IMPLANT
SPONGE LAP 4X18 X RAY DECT (DISPOSABLE) ×3 IMPLANT
STRIP CLOSURE SKIN 1/2X4 (GAUZE/BANDAGES/DRESSINGS) ×2 IMPLANT
SUCTION FRAZIER HANDLE 10FR (MISCELLANEOUS) ×2
SUCTION TUBE FRAZIER 10FR DISP (MISCELLANEOUS) ×1 IMPLANT
SUT FIBERWIRE #2 38 T-5 BLUE (SUTURE) ×6
SUT MNCRL AB 4-0 PS2 18 (SUTURE) ×3 IMPLANT
SUT VIC AB 2-0 CT1 27 (SUTURE) ×2
SUT VIC AB 2-0 CT1 TAPERPNT 27 (SUTURE) ×1 IMPLANT
SUT VICRYL 0 CT 1 36IN (SUTURE) ×3 IMPLANT
SUTURE FIBERWR #2 38 T-5 BLUE (SUTURE) ×2 IMPLANT
SYR CONTROL 10ML LL (SYRINGE) ×3 IMPLANT
TOWEL OR 17X24 6PK STRL BLUE (TOWEL DISPOSABLE) ×3 IMPLANT
TOWEL OR 17X26 10 PK STRL BLUE (TOWEL DISPOSABLE) ×3 IMPLANT
TOWER CARTRIDGE SMART MIX (DISPOSABLE) ×3 IMPLANT
TRAY FOLEY CATH 16FRSI W/METER (SET/KITS/TRAYS/PACK) IMPLANT
WATER STERILE IRR 1000ML POUR (IV SOLUTION) ×3 IMPLANT
YANKAUER SUCT BULB TIP NO VENT (SUCTIONS) ×6 IMPLANT

## 2015-07-23 NOTE — H&P (Signed)
  Tracey Roberts is an 75 y.o. female.    Chief Complaint: left shoulder pain  HPI: Pt is a 75 y.o. female complaining of left shoulder pain for multiple years. Pain had continually increased since the beginning. X-rays in the clinic show end-stage arthritic changes of the left shoulder with recent proximal humerus fracture. Pt has tried various conservative treatments which have failed to alleviate their symptoms, including injections and therapy. Various options are discussed with the patient. Risks, benefits and expectations were discussed with the patient. Patient understand the risks, benefits and expectations and wishes to proceed with surgery.   PCP:  Brooke BonitoGALLEMORE,WARREN, MD  D/C Plans: Home  PMH: Past Medical History  Diagnosis Date  . Coronary artery disease   . Peripheral vascular disease (HCC)   . Hypercholesterolemia   . Seasonal allergies   . Headache     migraines  . Insomnia   . Hypothyroidism   . Arthritis   . Humerus fracture     head and neck    PSH: Past Surgical History  Procedure Laterality Date  . Coronary angioplasty with stent placement    . Fracture surgery      left foot  . Joint replacement      right knee  . Medial partial knee replacement      left  . Tonsillectomy    . Appendectomy    . Cholecystectomy    . Dilation and curettage of uterus    . Tubal ligation    . Abdominal hysterectomy    . Colonoscopy w/ biopsies and polypectomy    . Multiple tooth extractions      Social History:  reports that she has never smoked. She has never used smokeless tobacco. She reports that she does not drink alcohol or use illicit drugs.  Allergies:  No Known Allergies  Medications: Current Facility-Administered Medications  Medication Dose Route Frequency Provider Last Rate Last Dose  . [START ON 07/24/2015] ceFAZolin (ANCEF) IVPB 2g/100 mL premix  2 g Intravenous On Call to OR Brad Ketsia Linebaugh, PA-C      . chlorhexidine (HIBICLENS) 4 % liquid 4 application   60 mL Topical Once Brad Raymel Cull, PA-C        No results found for this or any previous visit (from the past 48 hour(s)). No results found.  ROS: Pain with rom of the left upper extremity  Physical Exam:  Alert and oriented 75 y.o. 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. Risks benefits and expectations were discussed with the patient. Patient understand risks, benefits and expectations and wishes to proceed.

## 2015-07-23 NOTE — Interval H&P Note (Signed)
History and Physical Interval Note:  07/23/2015 3:05 PM  Tracey Roberts  has presented today for surgery, with the diagnosis of left shoulder osteoarthritis, acrominal  The various methods of treatment have been discussed with the patient and family. After consideration of risks, benefits and other options for treatment, the patient has consented to  Procedure(s): LEFT REVERSE SHOULDER ARTHROPLASTY, OS ACROMIALE REPAIR/OPEN REDUCTION INTERNAL FIXATION LEFT SHOULDER  (Left) as a surgical intervention .  The patient's history has been reviewed, patient examined, no change in status, stable for surgery.  I have reviewed the patient's chart and labs.  Questions were answered to the patient's satisfaction.     Edwinna Rochette,STEVEN R

## 2015-07-23 NOTE — Progress Notes (Signed)
Late entry. Patient received from  PACU at [id number] alert and oriented x4. No complaint of pain in left shoulder. Patient moving left fingers but unable to grip . Ice pack intact to left shoulder.LR infusing O2 st at 92% on o2 at 2 l/min [place]. Oriented patient to room and call bell system. Continue with plan of care .   Cleotilde NeerJoyce, Oliviah Agostini S

## 2015-07-23 NOTE — Transfer of Care (Signed)
Immediate Anesthesia Transfer of Care Note  Patient: Tracey RombergBarbara N Roberts  Procedure(s) Performed: Procedure(s): LEFT REVERSE SHOULDER ARTHROPLASTY, OS ACROMIALE REPAIR/OPEN REDUCTION INTERNAL FIXATION LEFT SHOULDER  (Left)  Patient Location: PACU  Anesthesia Type:General  Level of Consciousness: awake, alert  and oriented  Airway & Oxygen Therapy: Patient Spontanous Breathing and Patient connected to face mask oxygen  Post-op Assessment: Report given to RN and Post -op Vital signs reviewed and stable  Post vital signs: Reviewed and stable  Last Vitals:  Filed Vitals:   07/23/15 1435 07/23/15 1440  BP: 157/87 178/96  Pulse: 59 75  Resp: 12 17    Complications: No apparent anesthesia complications

## 2015-07-23 NOTE — Progress Notes (Signed)
Medical and cardiac clearance placed on chart, Dr. Malen GauzeFoster aware.

## 2015-07-23 NOTE — Progress Notes (Signed)
Office called by this nurse to send surgical clearance, to fax now.

## 2015-07-23 NOTE — Discharge Instructions (Signed)
Ice to the shoulder as much as you can.  May remove the sling while seated in the home.  Ok to use the walker with the left arm.  Please keep the left shoulder wound clean and dry and covered for one week, then ok to get the wound wet in the shower.  Do exercises every hour while awake.   Follow up in the office in two weeks with Dr Ranell PatrickNorris  [phone]

## 2015-07-23 NOTE — Brief Op Note (Signed)
07/23/2015  5:45 PM  PATIENT:  Leonidas RombergBarbara N Dombkowski  75 y.o. female  PRE-OPERATIVE DIAGNOSIS:  left shoulder osteoarthritis, displaced and comminuted proximal humerus fracture, os acrominale (possible unstable)  POST-OPERATIVE DIAGNOSIS:  left shoulder osteoarthritis, displaced and comminuted proximal humerus fracture, os acrominale (stable)  PROCEDURE:  Left shoulder exam under anesthesia, reverse total shoulder replacement, DePuy Delta Xtend, with repair of subscapularis, teres minor preserved  SURGEON:  Surgeon(s) and Role:    * Beverely LowSteve Trevin Gartrell, MD - Primary  PHYSICIAN ASSISTANT:   ASSISTANTS: Thea Gisthomas B Dixon, PA-C   ANESTHESIA:   regional and general  EBL:  Total I/O In: 1000 [I.V.:1000] Out: 125 [Blood:125]  BLOOD ADMINISTERED:none  DRAINS: none   LOCAL MEDICATIONS USED:  MARCAINE     SPECIMEN:  No Specimen  DISPOSITION OF SPECIMEN:  N/A  COUNTS:  YES  TOURNIQUET:  * No tourniquets in log *  DICTATION: .Other Dictation: Dictation Number [id number]  PLAN OF CARE: Admit to inpatient   PATIENT DISPOSITION:  PACU - hemodynamically stable.   Delay start of Pharmacological VTE agent (>24hrs) due to surgical blood loss or risk of bleeding: yes

## 2015-07-23 NOTE — Anesthesia Preprocedure Evaluation (Addendum)
Anesthesia Evaluation  Patient identified by MRN, date of birth, ID band Patient awake    Reviewed: Allergy & Precautions, NPO status , Patient's Chart, lab work & pertinent test results, reviewed documented beta blocker date and time   Airway Mallampati: III  TM Distance: >3 FB Neck ROM: Full    Dental no notable dental hx. (+) Teeth Intact, Caps   Pulmonary neg pulmonary ROS,    Pulmonary exam normal breath sounds clear to auscultation       Cardiovascular Exercise Tolerance: Good hypertension, Pt. on medications and Pt. on home beta blockers + CAD, + Cardiac Stents and + Peripheral Vascular Disease  Normal cardiovascular exam Rhythm:Regular Rate:Normal     Neuro/Psych  Headaches, negative psych ROS   GI/Hepatic negative GI ROS, Neg liver ROS,   Endo/Other  Hypothyroidism Hyperlipidemia  Renal/GU negative Renal ROS  negative genitourinary   Musculoskeletal  (+) Arthritis , Osteoarthritis,  Fx Left proximal humerus   Abdominal   Peds  Hematology On Plavix- last dose 07/22/2015   Anesthesia Other Findings   Reproductive/Obstetrics                            Anesthesia Physical Anesthesia Plan  ASA: III  Anesthesia Plan: General   Post-op Pain Management:    Induction: Intravenous  Airway Management Planned: Oral ETT  Additional Equipment:   Intra-op Plan:   Post-operative Plan: Extubation in OR  Informed Consent: I have reviewed the patients History and Physical, chart, labs and discussed the procedure including the risks, benefits and alternatives for the proposed anesthesia with the patient or authorized representative who has indicated his/her understanding and acceptance.   Dental advisory given  Plan Discussed with: CRNA, Anesthesiologist and Surgeon  Anesthesia Plan Comments:         Anesthesia Quick Evaluation

## 2015-07-23 NOTE — Anesthesia Procedure Notes (Addendum)
Anesthesia Regional Block:  Supraclavicular block  Pre-Anesthetic Checklist: ,, timeout performed, Correct Patient, Correct Site, Correct Laterality, Correct Procedure, Correct Position, site marked, Risks and benefits discussed,  Surgical consent,  Pre-op evaluation,  At surgeon's request and post-op pain management  Laterality: Left and Upper  Prep: chloraprep       Needles:  Injection technique: Single-shot  Needle Type: Echogenic Stimulator Needle     Needle Length: 9cm 9 cm Needle Gauge: 21 and 21 G  Needle insertion depth: 4 cm   Additional Needles: Supraclavicular block Narrative:  Injection made incrementally with aspirations every 5 mL.  Performed by: Personally  Anesthesiologist: Mal AmabileFOSTER, MICHAEL  Additional Notes: Patient tolerated procedure well.   Procedure Name: Intubation Date/Time: 07/23/2015 3:50 PM Performed by: Daiva EvesAVENEL, Perrin Gens W Pre-anesthesia Checklist: Emergency Drugs available, Patient being monitored, Suction available, Patient identified and Timeout performed Patient Re-evaluated:Patient Re-evaluated prior to inductionOxygen Delivery Method: Circle system utilized Preoxygenation: Pre-oxygenation with 100% oxygen Intubation Type: IV induction Ventilation: Mask ventilation without difficulty Laryngoscope Size: Mac and 3 Grade View: Grade II Tube type: Oral Tube size: 7.0 mm Number of attempts: 1 Airway Equipment and Method: Stylet Placement Confirmation: ETT inserted through vocal cords under direct vision,  breath sounds checked- equal and bilateral,  positive ETCO2 and CO2 detector Secured at: 22 cm Tube secured with: Tape Dental Injury: Teeth and Oropharynx as per pre-operative assessment

## 2015-07-23 NOTE — Anesthesia Postprocedure Evaluation (Signed)
Anesthesia Post Note  Patient: Tracey RombergBarbara N Roberts  Procedure(s) Performed: Procedure(s) (LRB): LEFT REVERSE SHOULDER ARTHROPLASTY, OS ACROMIALE REPAIR/OPEN REDUCTION INTERNAL FIXATION LEFT SHOULDER  (Left)  Patient location during evaluation: PACU Anesthesia Type: General and Regional Level of consciousness: awake and alert Pain management: pain level controlled Vital Signs Assessment: post-procedure vital signs reviewed and stable Respiratory status: spontaneous breathing, nonlabored ventilation, respiratory function stable and patient connected to nasal cannula oxygen Cardiovascular status: blood pressure returned to baseline and stable Postop Assessment: no signs of nausea or vomiting Anesthetic complications: no    Last Vitals:  Filed Vitals:   07/23/15 1819 07/23/15 1848  BP: 116/80 104/57  Pulse: 83 75  Temp:    Resp: 24     Last Pain:  Filed Vitals:   07/23/15 1851  PainSc: 5                  Teshara Moree,W. EDMOND

## 2015-07-24 LAB — BASIC METABOLIC PANEL
ANION GAP: 7 (ref 5–15)
BUN: 12 mg/dL (ref 6–20)
CALCIUM: 8.5 mg/dL — AB (ref 8.9–10.3)
CO2: 27 mmol/L (ref 22–32)
Chloride: 105 mmol/L (ref 101–111)
Creatinine, Ser: 0.91 mg/dL (ref 0.44–1.00)
GFR calc Af Amer: >60 mL/min (ref >60)
GLUCOSE: 110 mg/dL — AB (ref 65–99)
POTASSIUM: 4.2 mmol/L (ref 3.5–5.1)
SODIUM: 139 mmol/L (ref 135–145)

## 2015-07-24 LAB — HEMOGLOBIN AND HEMATOCRIT, BLOOD
HEMATOCRIT: 29.3 % — AB (ref 36.0–46.0)
HEMOGLOBIN: 9.1 g/dL — AB (ref 12.0–15.0)

## 2015-07-24 NOTE — Progress Notes (Signed)
Orthopedics Progress Note  Subjective: No pain this morning as block still working  Objective:  Filed Vitals:   07/23/15 2145 07/24/15 0340  BP: 118/70 120/72  Pulse: 62 65  Temp: 98.1 F (36.7 C) 98.1 F (36.7 C)  Resp:      General: Awake and alert  Musculoskeletal: left shoulder dressing intact Neurovascularly intact  Lab Results  Component Value Date   WBC 6.1 07/23/2015   HGB 12.2 07/23/2015   HCT 37.4 07/23/2015   MCV 94.9 07/23/2015   PLT 387 07/23/2015       Component Value Date/Time   NA 138 07/23/2015 1323   K 4.3 07/23/2015 1323   CL 103 07/23/2015 1323   CO2 23 07/23/2015 1323   GLUCOSE 92 07/23/2015 1323   BUN 16 07/23/2015 1323   CREATININE 0.92 07/23/2015 1323   CALCIUM 9.5 07/23/2015 1323   GFRNONAA >60 07/23/2015 1323   GFRAA >60 07/23/2015 1323    No results found for: INR, PROTIME  Assessment/Plan: POD #1 s/p Procedure(s): LEFT REVERSE SHOULDER ARTHROPLASTY, OS ACROMIALE REPAIR/OPEN REDUCTION INTERNAL FIXATION LEFT SHOULDER  OT - conservative protocol, minimal WB, ok to use walker for balance PT for mobility and risk/fall assessment Possible D/C to home tomorrow with home health OT, PT  Almedia BallsSteven R. Ranell PatrickNorris, MD 07/24/2015 7:24 AM

## 2015-07-24 NOTE — Evaluation (Signed)
Occupational Therapy Evaluation Patient Details Name: Leonidas RombergBarbara N Levandoski MRN: [id number] DOB: 1940/07/05 Today's Date: 07/24/2015    History of Present Illness Pt is a 75 y.o. female s/p LEFT REVERSE SHOULDER ARTHROPLASTY, OS ACROMIALE REPAIR/OPEN REDUCTION INTERNAL FIXATION LEFT SHOULDER. PMHx: Peripheral vascular disease, CAD, Hypercholesterolemia, Arithritis, Humerus fx.    Clinical Impression   Pt reports she was independent with ADLs and mobility PTA. Currently pt overall min assist for ADLs and functional mobility. Began shoulder, safety, and ADL education with pt and family. Pt c/o continued numbness in hand; states it is from the nerve block. Pt planning to d/c home with 24/7 supervision from her husband. Pt would benefit from continued skilled OT to address established goals.    Follow Up Recommendations  Other (comment) (follow up per MD)    Equipment Recommendations  None recommended by OT    Recommendations for Other Services PT consult     Precautions / Restrictions Precautions Precautions: Fall;Shoulder Type of Shoulder Precautions: Conservative protocol: NO AROM/PROM shoulder. AROM elbow, wrist, hand OK. NO pendulums. Shoulder Interventions: Shoulder sling/immobilizer;At all times;Off for dressing/bathing/exercises Precaution Booklet Issued: Yes (comment) Precaution Comments: Educated pt and husband on precautions. Required Braces or Orthoses: Sling Restrictions Weight Bearing Restrictions: Yes LUE Weight Bearing: Non weight bearing (Can minimally WB for use of RW for balance)      Mobility Bed Mobility Overal bed mobility: Needs Assistance Bed Mobility: Supine to Sit     Supine to sit: Min assist;HOB elevated     General bed mobility comments: Min hand held assist to pull trunk into sitting position. HOB slightly elevated, no use of hand rail.   Transfers Overall transfer level: Needs assistance Equipment used: 1 person hand held assist Transfers: Sit  to/from Stand Sit to Stand: Min assist         General transfer comment: Min hand held assist to boost up from EOB x 1, toilet x 1. Hand held assist provided for balance in standing and with ambulation.    Balance Overall balance assessment: Needs assistance Sitting-balance support: Feet supported;No upper extremity supported Sitting balance-Leahy Scale: Good     Standing balance support: Single extremity supported;During functional activity Standing balance-Leahy Scale: Fair                              ADL Overall ADL's : Needs assistance/impaired Eating/Feeding: Set up;Sitting   Grooming: Min guard;Standing;Wash/dry hands   Upper Body Bathing: Moderate assistance;Sitting Upper Body Bathing Details (indicate cue type and reason): Educated pt on UB bathing technique. Lower Body Bathing: Min guard;Sit to/from stand   Upper Body Dressing : Moderate assistance;Sitting Upper Body Dressing Details (indicate cue type and reason): To don sling. Educated pt on UB dressing technique. Lower Body Dressing: Min guard;Sit to/from stand Lower Body Dressing Details (indicate cue type and reason): Pt able to pull up bil socks sitting EOB. Toilet Transfer: Minimal assistance;Ambulation;Comfort height toilet (1 person hand held assist)   Toileting- Clothing Manipulation and Hygiene: Min guard;Sitting/lateral lean Toileting - Clothing Manipulation Details (indicate cue type and reason): for toilet hygiene     Functional mobility during ADLs: Minimal assistance (1 person hand held assist) General ADL Comments: Educated pt and husband on sling management and wear schedule, RUE positioning in bed and chair, ice for edema and pain, bed mobility technique, NWB status for RUE. Recommended using shower chair for safety initially; pt and husband agreeable.      Vision Vision  Assessment?: No apparent visual deficits   Perception     Praxis      Pertinent Vitals/Pain Pain  Assessment: Faces Faces Pain Scale: Hurts little more Pain Location: L shoulder Pain Descriptors / Indicators: Sore Pain Intervention(s): Limited activity within patient's tolerance;Monitored during session;Premedicated before session;Repositioned;Ice applied     Hand Dominance Right   Extremity/Trunk Assessment Upper Extremity Assessment Upper Extremity Assessment: LUE deficits/detail LUE Deficits / Details: Wrist and hand ROM WFL. Pt reports hand still numb from nerve block LUE: Unable to fully assess due to immobilization   Lower Extremity Assessment Lower Extremity Assessment: Defer to PT evaluation   Cervical / Trunk Assessment Cervical / Trunk Assessment: Normal   Communication Communication Communication: No difficulties   Cognition Arousal/Alertness: Awake/alert Behavior During Therapy: WFL for tasks assessed/performed Overall Cognitive Status: Within Functional Limits for tasks assessed                     General Comments       Exercises Exercises: Shoulder     Shoulder Instructions Shoulder Instructions Donning/doffing shirt without moving shoulder: Moderate assistance (educated) Method for sponge bathing under operated UE: Moderate assistance (educated) Donning/doffing sling/immobilizer: Moderate assistance (educated) Correct positioning of sling/immobilizer: Minimal assistance (educated) Sling wearing schedule (on at all times/off for ADL's): Supervision/safety (educated) Proper positioning of operated UE when showering: Minimal assistance (educated) Positioning of UE while sleeping: Minimal assistance (educated)    Home Living Family/patient expects to be discharged to:: Private residence Living Arrangements: Spouse/significant other Available Help at Discharge: Family;Available 24 hours/day Type of Home: House Home Access: Stairs to enter [employer] of Steps: 2 Entrance Stairs-Rails: None Home Layout: One level     Bathroom  Shower/Tub: Tub/shower unit Shower/tub characteristics: Curtain Firefighter: Handicapped height     Home Equipment: Environmental consultant - 2 wheels;Walker - standard;Cane - quad;Cane - single point;Bedside commode;Shower seat          Prior Functioning/Environment Level of Independence: Independent             OT Diagnosis: Generalized weakness;Acute pain   OT Problem List: Decreased strength;Decreased range of motion;Impaired balance (sitting and/or standing);Decreased knowledge of use of DME or AE;Decreased knowledge of precautions;Impaired sensation;Impaired UE functional use;Pain   OT Treatment/Interventions: Self-care/ADL training;Therapeutic exercise;Energy conservation;DME and/or AE instruction;Therapeutic activities;Patient/family education;Balance training    OT Goals(Current goals can be found in the care plan section) Acute Rehab OT Goals Patient Stated Goal: return to independence OT Goal Formulation: With patient/family Time For Goal Achievement: 08/07/15 Potential to Achieve Goals: Good ADL Goals Pt Will Perform Upper Body Bathing: with supervision;sitting Pt Will Perform Upper Body Dressing: with supervision;sitting Pt Will Transfer to Toilet: with supervision;ambulating;regular height toilet Pt Will Perform Toileting - Clothing Manipulation and hygiene: with supervision;sit to/from stand Pt Will Perform Tub/Shower Transfer: Tub transfer;with supervision;ambulating;shower seat Pt/caregiver will Perform Home Exercise Program: Increased ROM;Left upper extremity;Independently;With written HEP provided Additional ADL Goal #1: Pt/caregiver will independently don/doff sling as precursor for ADLs and functional mobility.  OT Frequency: Min 2X/week   Barriers to D/C:            Co-evaluation              End of Session Equipment Utilized During Treatment: Other (comment) (sling) Nurse Communication: Mobility status (RN tech notified)  Activity Tolerance: Patient  tolerated treatment well Patient left: in chair;with call bell/phone within reach;with family/visitor present   Time: 5621-3086 OT Time Calculation (min): 25 min Charges:  OT General Charges $  OT Visit: 1 Procedure OT Evaluation $OT Eval Moderate Complexity: 1 Procedure OT Treatments $Self Care/Home Management : 8-22 mins G-Codes:     Gaye Alken M.S., OTR/L Pager: [phone]  07/24/2015, 9:35 AM

## 2015-07-24 NOTE — Op Note (Signed)
Tracey Roberts:  Tracey Roberts, Tracey Roberts                 ACCOUNT NO.:  [id number]  MEDICAL RECORD NO.:  [id number]  LOCATION:  [id number]                        FACILITY:  [facility]  PHYSICIAN:  Almedia BallsSteven R. Ranell PatrickNorris, M.D. 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  POSTOPERATIVE DIAGNOSES: 1. Left shoulder end-stage arthritis. 2. Left shoulder displaced and comminuted proximal humerus fracture. 3. Stable os acromiale.  PROCEDURE PERFORMED:  Left shoulder reverse total shoulder arthroplasty with retention of the teres minor muscle attachment and repair of the subscapularis tendon.  We did not have to perform an open reduction and internal fixation of the os acromiale or bone grafting.  ATTENDING SURGEON:  Almedia BallsSteven R. Ranell PatrickNorris, M.D.  ASSISTANT:  Donnie Coffinhomas B. Dixon, PA-C, who has scrubbed during the entire procedure and necessary for satisfactory completion of surgery.  ANESTHESIA:  General anesthesia was used plus interscalene block.  ESTIMATED BLOOD LOSS:  200 mL.  FLUID REPLACED:  1500 mL crystalloid.  COUNTS:  Instrument counts were correct.  COMPLICATIONS:  There were no complications.  ANTIBIOTICS:  Perioperative antibiotics were given.  INDICATIONS:  The patient is a 75 year old female with a history of worsening left shoulder pain secondary to end-stage arthritis in the shoulder.  She was already scheduled for reverse shoulder replacement for end-stage OA and rotator cuff insufficiency and possible os acromiale ORIF when she fell about a week and half ago breaking her proximal humerus.  She had a comminuted and displaced anatomic neck fracture with significant head destruction.  We counseled the patient regarding options for management.  She was scheduled for about a month from now.  We  went ahead and moved her up given her fractured shoulder given a much of pain she was at.  Risks and benefits of the surgery were discussed.  Informed consent obtained.  DESCRIPTION OF PROCEDURE:  After an adequate level of anesthesia achieved, the patient positioned in the modified beach-chair position. The left shoulder was correctly identified and examined under anesthesia.  We noted that the patient to have a stable feeling os acromiale.  The anterior portion of the acromion was stable and direct pressure was applied.  Sterile prep and drape performed.  Time-out called.  We entered the shoulder using standard deltopectoral approach starting at the coracoid process extending down to the anterior humerus. Dissection down through the subcutaneous tissues with the Bovie, identified the cephalic vein, took it laterally with the deltoid, pectoralis taken medially.  I was able to reach up under the acromion and pushed from the underside and it was quite stable, did not feel any step-off or any gap.  We decided at this point to procedure with a standard reverse total shoulder arthroplasty for fracture.  At this point, the proximal humerus was dissected using a Bovie.  We identified the lesser tuberosity and subscapularis.  The lesser tuberosity was debulked down and #2 Hi-Fi suture placed in a modified Debbie stitch fashion into the free edge of the subscapularis incorporating the tuberosity and that  was used for retraction and repair.  We then removed the anterior portion of the greater tuberosity and the attached rotator cuff involving supra and infraspinatus.  We did leave the teres minor intact posteriorly.  We removed any potentially impinging soft tissue from the dorsal aspect of the shoulder including the rotator cuff there, but left a cuff tendon intact, the teres minor in the back.  We then removed excess broken bone fragments including the head, which was in a couple of pieces and  showed advanced arthritis on it.  We were able to get to where all we had was the socket and the glenoid, everything appeared to be in good shape.  There was quite a bit of labral degeneration and tearing noted consistent with osteoarthritis.  There was really no cartilage on the glenoid face, but we did go and removed the glenoid labrum for better visualization and appropriate placement of our metaglene.  We then placed our central guidepin, reamed with our reamer for the metaglene and then drilled our central peg hole.  We impacted the DePuy Delta Xtend metaglene into position.  We placed a 48 locked screw inferiorly and then a 30 into the base of the coracoid and then an 18 nonlocked posteriorly.  We had good purchase and excellent stability.  We then placed a 38 standard glenosphere in proper position and secured that to the baseplate.  Next, we went ahead and prepared the humerus.  We extended the humerus and we were able to enter the broken humeral shaft with a 6 reamer, then an 8 and a 10, with the 10 reamer about three-quarters of the way down.  We then took the 10 Monobloc humerus component, which was a size 110 and then placed that in approximately 10 degrees of retroversion and impacted that in position. The height seemed appropriate as we reduced it with a 38+ 3 and that seemed to be appropriate height.  We removed the trial components from the humerus, irrigated thoroughly and then used DePuy 1 cement, vacuum mixed that and then pressurized and placed that into the dried humeral shaft.  We then pressed the cemented Monobloc 10 size 1 to about 10 degrees of retroversion to the appropriate height to allow the cement to harden.  We then trialed again with a +3 and felt a little bit loose. We went ahead and made a decision based on the sulcus and the external rotation gapping to go to a +9 and when we selected the trial +9, it worked out perfectly.  The stability was excellent.   Then, we selected the real +9 and impacted that in position, reduced the shoulder, pleased with the balance.  Again, teres minor intact, so we were thankful for that.  We then went ahead and repaired the subscap anatomically back to the soft tissue on the lateral side of the humerus and also to bone directly and felt like that it was quite stable and the fact heels together we should get good balance, forces subscap against teres minor for improved function and strength.  We irrigated the wound thoroughly and closed the deltopectoral interval with 0 Vicryl suture followed by 2- 0 Vicryl subcutaneous closure and 4-0 running Monocryl for skin.  Steri- Strips applied followed by sterile dressing.  The patient tolerated the surgery well.     Almedia Balls. Ranell Patrick, M.D.     SRN/MEDQ  D:  07/23/2015  T:  07/23/2015  Job:  [id number]

## 2015-07-24 NOTE — Evaluation (Signed)
Physical Therapy Evaluation Patient Details Name: Tracey Roberts MRN: [id number] DOB: [birth year] Today's Date: 07/24/2015   History of Present Illness  Pt is a 75 y.o. female s/p LEFT REVERSE SHOULDER ARTHROPLASTY, OS ACROMIALE REPAIR/OPEN REDUCTION INTERNAL FIXATION LEFT SHOULDER. PMHx: Peripheral vascular disease, CAD, Hypercholesterolemia, Arithritis, Humerus fx.   Clinical Impression  Patient is s/p above surgery resulting in functional limitations due to the deficits listed below (see PT Problem List).  Patient will benefit from skilled PT to increase their independence and safety with mobility to allow discharge to home with family support. Pt with mild instability during ambulation with loss of balance x2 with independent recovery. Pt declining use of assistive device. Will further assess mobility and recommendations at next session.        Follow Up Recommendations Home health PT;Supervision for mobility/OOB    Equipment Recommendations  None recommended by PT    Recommendations for Other Services       Precautions / Restrictions Precautions Precautions: Fall;Shoulder Type of Shoulder Precautions: Conservative protocol: NO AROM/PROM shoulder. AROM elbow, wrist, hand OK. NO pendulums. Shoulder Interventions: Shoulder sling/immobilizer;At all times;Off for dressing/bathing/exercises Precaution Booklet Issued: Yes (comment) Precaution Comments: Educated pt and husband on precautions. Required Braces or Orthoses: Sling Restrictions Weight Bearing Restrictions: Yes LUE Weight Bearing: Non weight bearing (Can minimally WB for use of RW for balance)      Mobility  Bed Mobility Overal bed mobility: Needs Assistance Bed Mobility: Supine to Sit     Supine to sit: Min assist;HOB elevated     General bed mobility comments: up in chair upon arrival  Transfers Overall transfer level: Needs assistance Equipment used: None Transfers: Sit to/from Stand Sit to Stand: Min guard          General transfer comment: using Rt LE from armrest  Ambulation/Gait Ambulation/Gait assistance: Min guard Ambulation Distance (Feet): 160 Feet Assistive device: 1 person hand held assist;None Gait Pattern/deviations: Decreased step length - right;Decreased step length - left;Step-through pattern Gait velocity: decreased   General Gait Details: Starting with HHA and progressing to min guard assist. Pt with 2 losses of balance with independent recovery. She declined using cane or other assistive device.   Stairs            Wheelchair Mobility    Modified Rankin (Stroke Patients Only)       Balance Overall balance assessment: Needs assistance Sitting-balance support: No upper extremity supported Sitting balance-Leahy Scale: Good     Standing balance support: During functional activity Standing balance-Leahy Scale: Fair Standing balance comment: mild instability during ambulation                             Pertinent Vitals/Pain Pain Assessment: 0-10 Pain Score: 6  Faces Pain Scale: Hurts little more Pain Location: Lt shoulder Pain Descriptors / Indicators: Aching;Sore Pain Intervention(s): Monitored during session;Ice applied    Home Living Family/patient expects to be discharged to:: Private residence Living Arrangements: Spouse/significant other Available Help at Discharge: Family;Available 24 hours/day Type of Home: House Home Access: Stairs to enter Entrance Stairs-Rails: None Entrance Stairs-Number of Steps: 2 Home Layout: One level Home Equipment: Walker - 2 wheels;Walker - standard;Cane - quad;Cane - single point;Bedside commode;Shower seat      Prior Function Level of Independence: Independent         Comments: out working in the garden before surgery     Hand Dominance   Dominant Hand: Right  Extremity/Trunk Assessment   Upper Extremity Assessment: Defer to OT evaluation       LUE Deficits / Details: Wrist and  hand ROM WFL. Pt reports hand still numb from nerve block   Lower Extremity Assessment: Overall WFL for tasks assessed      Cervical / Trunk Assessment: Normal  Communication   Communication: No difficulties  Cognition Arousal/Alertness: Awake/alert Behavior During Therapy: WFL for tasks assessed/performed Overall Cognitive Status: Within Functional Limits for tasks assessed                      General Comments      Exercises Donning/doffing shirt without moving shoulder: Moderate assistance (educated) Method for sponge bathing under operated UE: Moderate assistance (educated) Donning/doffing sling/immobilizer: Moderate assistance (educated) Correct positioning of sling/immobilizer: Minimal assistance (educated) Sling wearing schedule (on at all times/off for ADL's): Supervision/safety (educated) Proper positioning of operated UE when showering: Minimal assistance (educated) Positioning of UE while sleeping: Minimal assistance (educated)      Assessment/Plan    PT Assessment Patient needs continued PT services  PT Diagnosis Difficulty walking   PT Problem List Decreased activity tolerance;Decreased balance;Decreased mobility  PT Treatment Interventions     PT Goals (Current goals can be found in the Care Plan section) Acute Rehab PT Goals Patient Stated Goal: get back home PT Goal Formulation: With patient Time For Goal Achievement: 08/07/15 Potential to Achieve Goals: Good    Frequency Min 3X/week   Barriers to discharge        Co-evaluation               End of Session Equipment Utilized During Treatment: Gait belt;Other (comment) (sling) Activity Tolerance: Patient tolerated treatment well Patient left: in chair;with call bell/phone within reach;Other (comment) (LLE supported) Nurse Communication: Mobility status    Functional Assessment Tool Used: clinical judgment Functional Limitation: Mobility: Walking and moving around Mobility:  Walking and Moving Around Current Status ([id number]): At least 20 percent but less than 40 percent impaired, limited or restricted Mobility: Walking and Moving Around Goal Status [phone](G8979): At least 1 percent but less than 20 percent impaired, limited or restricted    Time: 0941-1001 PT Time Calculation (min) (ACUTE ONLY): 20 min   Charges:   PT Evaluation $PT Eval Moderate Complexity: 1 Procedure     PT G Codes:   PT G-Codes **NOT FOR INPATIENT CLASS** Functional Assessment Tool Used: clinical judgment Functional Limitation: Mobility: Walking and moving around Mobility: Walking and Moving Around Current Status ([id number](G8978): At least 20 percent but less than 40 percent impaired, limited or restricted Mobility: Walking and Moving Around Goal Status [phone](G8979): At least 1 percent but less than 20 percent impaired, limited or restricted    Christiane HaBenjamin J. Roxanne Orner, PT, CSCS Pager [phone] Office [phone]  07/24/2015, 10:14 AM

## 2015-07-24 NOTE — Progress Notes (Signed)
Occupational Therapy Treatment Patient Details Name: Tracey Roberts MRN: [id number] DOB: 1940/05/28 Today's Date: 07/24/2015    History of present illness Pt is a 75 y.o. female s/p LEFT REVERSE SHOULDER ARTHROPLASTY, OS ACROMIALE REPAIR/OPEN REDUCTION INTERNAL FIXATION LEFT SHOULDER. PMHx: Peripheral vascular disease, CAD, Hypercholesterolemia, Arithritis, Humerus fx.    OT comments  Pt making progress toward OT goals. Tolerating ROM exercises to L elbow, wrist, hand but required active assist secondary to continued numbness from nerve block. Pt able to teach back shoulder education provided in AM session. D/c plan remains appropriate. Will continue to follow acutely.    Follow Up Recommendations  Other (comment) (follow up per MD)    Equipment Recommendations  None recommended by OT    Recommendations for Other Services PT consult    Precautions / Restrictions Precautions Precautions: Fall;Shoulder Type of Shoulder Precautions: Conservative protocol: NO AROM/PROM shoulder. AROM elbow, wrist, hand OK. NO pendulums. Shoulder Interventions: Shoulder sling/immobilizer;At all times;Off for dressing/bathing/exercises Precaution Booklet Issued: Yes (comment) Precaution Comments: Reviewed precautions with pt and husband Required Braces or Orthoses: Sling Restrictions Weight Bearing Restrictions: Yes LUE Weight Bearing: Non weight bearing (Can minimally WB for use of RW for balance)       Mobility Bed Mobility Overal bed mobility: Needs Assistance Bed Mobility: Supine to Sit;Sit to Supine     Supine to sit: Min guard;HOB elevated Sit to supine: Min guard   General bed mobility comments: Min guard for safety. HOB elevated, no use of bed rails.  Transfers Overall transfer level: Needs assistance Equipment used: None Transfers: Sit to/from Stand Sit to Stand: Min guard         General transfer comment: Pt able to boost up from EOB with RUE. Min guard for safety.     Balance Overall balance assessment: Needs assistance Sitting-balance support: Feet unsupported;No upper extremity supported Sitting balance-Leahy Scale: Good     Standing balance support: During functional activity Standing balance-Leahy Scale: Fair Standing balance comment: mild instability during ambulation                   ADL Overall ADL's : Needs assistance/impaired Eating/Feeding: Set up;Sitting   Grooming: Min guard;Standing;Wash/dry hands   Upper Body Bathing: Moderate assistance;Sitting Upper Body Bathing Details (indicate cue type and reason): Educated pt on UB bathing technique. Lower Body Bathing: Min guard;Sit to/from stand   Upper Body Dressing : Minimal assistance;Sitting Upper Body Dressing Details (indicate cue type and reason): To doff hospital gown and doff/don sling. Lower Body Dressing: Min guard;Sit to/from stand Lower Body Dressing Details (indicate cue type and reason): Pt able to pull up bil socks sitting EOB. Toilet Transfer: Minimal assistance;Ambulation;Comfort height toilet (1 person hand held assist)   Toileting- Clothing Manipulation and Hygiene: Min guard;Sitting/lateral lean Toileting - Clothing Manipulation Details (indicate cue type and reason): for toilet hygiene     Functional mobility during ADLs: Minimal assistance (1 person hand held assist) General ADL Comments: Educated pt and husband on L UE exercises (elbow, wrist, hand); pt tolerating exercises well but continues to have decreased sensation in LUE impacting AROM, therefore AAROM performed. Pt able to teach back shoulder education provided in AM session.       Vision                     Perception     Praxis      Cognition   Behavior During Therapy: [facility] for tasks assessed/performed Overall Cognitive Status: Within Functional Limits for  tasks assessed                       Extremity/Trunk Assessment  Upper Extremity Assessment Upper Extremity  Assessment: Defer to OT evaluation LUE Deficits / Details: Wrist and hand ROM WFL. Pt reports hand still numb from nerve block LUE: Unable to fully assess due to immobilization   Lower Extremity Assessment Lower Extremity Assessment: Overall WFL for tasks assessed   Cervical / Trunk Assessment Cervical / Trunk Assessment: Normal    Exercises Shoulder Exercises Elbow Flexion: AAROM;Left;10 reps;Seated (full ROM) Wrist Flexion: AAROM;Left;10 reps;Seated (full ROM) Digit Composite Flexion: AAROM;Left;10 reps;Seated (full ROM) Donning/doffing shirt without moving shoulder: Moderate assistance (educated) Method for sponge bathing under operated UE: Moderate assistance (educated) Donning/doffing sling/immobilizer: Minimal assistance Correct positioning of sling/immobilizer: Supervision/safety ROM for elbow, wrist and digits of operated UE: Minimal assistance (educated) Sling wearing schedule (on at all times/off for ADL's): Supervision/safety Proper positioning of operated UE when showering: Minimal assistance (educated) Positioning of UE while sleeping: Minimal assistance   Shoulder Instructions Shoulder Instructions Donning/doffing shirt without moving shoulder: Moderate assistance (educated) Method for sponge bathing under operated UE: Moderate assistance (educated) Donning/doffing sling/immobilizer: Minimal assistance Correct positioning of sling/immobilizer: Supervision/safety ROM for elbow, wrist and digits of operated UE: Minimal assistance (educated) Sling wearing schedule (on at all times/off for ADL's): Supervision/safety Proper positioning of operated UE when showering: Minimal assistance (educated) Positioning of UE while sleeping: Minimal assistance     General Comments      Pertinent Vitals/ Pain       Pain Assessment: Faces Pain Score: 6  Faces Pain Scale: Hurts little more Pain Location: L shoulder Pain Descriptors / Indicators: Sore;Grimacing Pain  Intervention(s): Monitored during session;Limited activity within patient's tolerance;Repositioned;Ice applied  Home Living Family/patient expects to be discharged to:: Private residence Living Arrangements: Spouse/significant other Available Help at Discharge: Family;Available 24 hours/day Type of Home: House Home Access: Stairs to enter [employer] of Steps: 2 Entrance Stairs-Rails: None Home Layout: One level     Bathroom Shower/Tub: Tub/shower unit Shower/tub characteristics: Curtain Firefighter: Handicapped height     Home Equipment: Environmental consultant - 2 wheels;Walker - standard;Cane - quad;Cane - single point;Bedside commode;Shower seat          Prior Functioning/Environment Level of Independence: Independent        Comments: out working in the garden before surgery   Frequency Min 2X/week     Progress Toward Goals  OT Goals(current goals can now be found in the care plan section)  Progress towards OT goals: Progressing toward goals  Acute Rehab OT Goals Patient Stated Goal: get back home OT Goal Formulation: With patient/family Time For Goal Achievement: 08/07/15 Potential to Achieve Goals: Good ADL Goals Pt Will Perform Upper Body Bathing: with supervision;sitting Pt Will Perform Upper Body Dressing: with supervision;sitting Pt Will Transfer to Toilet: with supervision;ambulating;regular height toilet Pt Will Perform Toileting - Clothing Manipulation and hygiene: with supervision;sit to/from stand Pt Will Perform Tub/Shower Transfer: Tub transfer;with supervision;ambulating;shower seat Pt/caregiver will Perform Home Exercise Program: Increased ROM;Left upper extremity;Independently;With written HEP provided Additional ADL Goal #1: Pt/caregiver will independently don/doff sling as precursor for ADLs and functional mobility.  Plan Discharge plan remains appropriate    Co-evaluation                 End of Session Equipment Utilized During  Treatment: Other (comment) (sling)   Activity Tolerance Patient tolerated treatment well   Patient Left in bed;with call bell/phone  within reach;with family/visitor present   Nurse Communication Mobility status (RN tech notified)        Time: [id number] OT Time Calculation (min): 11 min  Charges: OT General Charges $OT Visit: 1 Procedure OT Treatments $Therapeutic Exercise: 8-22 mins  Gaye AlkenBailey A Kerah Hardebeck M.S., OTR/L Pager: [phone]  07/24/2015, 11:23 AM

## 2015-07-25 NOTE — Progress Notes (Signed)
Patient is discharged from room 5N18 at this time. Alert and in stable condition. IV site d/c'd and instructions read to patient and family with understanding verbalized. Left unit via wheelchair with all belongings at side.

## 2015-07-25 NOTE — Progress Notes (Signed)
Orthopedics Progress Note  Subjective: Patient doing very well and ready for discharge  Objective:  Filed Vitals:   07/24/15 1954 07/25/15 0606  BP: 101/55 94/45  Pulse: 58 65  Temp: 98.9 F (37.2 C) 99.2 F (37.3 C)  Resp: 16 16    General: Awake and alert  Musculoskeletal: left shoulder dressing changed, incision looks great, no erythema and no drainage Neurovascularly intact  Lab Results  Component Value Date   WBC 6.1 07/23/2015   HGB 9.1* 07/24/2015   HCT 29.3* 07/24/2015   MCV 94.9 07/23/2015   PLT 387 07/23/2015       Component Value Date/Time   NA 139 07/24/2015 0654   K 4.2 07/24/2015 0654   CL 105 07/24/2015 0654   CO2 27 07/24/2015 0654   GLUCOSE 110* 07/24/2015 0654   BUN 12 07/24/2015 0654   CREATININE 0.91 07/24/2015 0654   CALCIUM 8.5* 07/24/2015 0654   GFRNONAA >60 07/24/2015 0654   GFRAA >60 07/24/2015 0654    No results found for: INR, PROTIME  Assessment/Plan: POD #2 s/p Procedure(s): LEFT REVERSE SHOULDER ARTHROPLASTY for 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 Ranell PatrickNorris, MD 07/25/2015 9:32 AM

## 2015-07-25 NOTE — Discharge Summary (Signed)
Physician Discharge Summary   Patient ID: Tracey RombergBarbara N Jacquet MRN: [id number] DOB/AGE: 1940/10/04 75 y.o.  Admit date: 07/23/2015 Discharge date: 07/25/2015  Admission Diagnoses:  Active Problems:   S/P shoulder replacement   Discharge Diagnoses:  Same   Surgeries: Procedure(s): LEFT REVERSE SHOULDER ARTHROPLASTY, OS ACROMIALE REPAIR/OPEN REDUCTION INTERNAL FIXATION LEFT SHOULDER  on 07/23/2015   Consultants: OT  Discharged Condition: Stable  Hospital Course: Tracey RombergBarbara N Mayweather is an 75 y.o. female who was admitted 07/23/2015 with a chief complaint of left shoulder pain, and found to have a diagnosis of left shoulder displaced and comminuted proximal humerus fracture and possible unstable Os Acromionale.  They were brought to the operating room on 07/23/2015 and underwent the above named procedures. Patient was noted to have a stable Os at the time of surgery, thus ORIF not required. The patient had an uncomplicated hospital course and was stable for discharge.  Recent vital signs:  Filed Vitals:   07/24/15 1954 07/25/15 0606  BP: 101/55 94/45  Pulse: 58 65  Temp: 98.9 F (37.2 C) 99.2 F (37.3 C)  Resp: 16 16    Recent laboratory studies:  Results for orders placed or performed during the hospital encounter of 07/23/15  Basic metabolic panel  Result Value Ref Range   Sodium 138 135 - 145 mmol/L   Potassium 4.3 3.5 - 5.1 mmol/L   Chloride 103 101 - 111 mmol/L   CO2 23 22 - 32 mmol/L   Glucose, Bld 92 65 - 99 mg/dL   BUN 16 6 - 20 mg/dL   Creatinine, Ser 4.090.92 0.44 - 1.00 mg/dL   Calcium 9.5 8.9 - 81.110.3 mg/dL   GFR calc non Af Amer >60 >60 mL/min   GFR calc Af Amer >60 >60 mL/min   Anion gap 12 5 - 15  CBC  Result Value Ref Range   WBC 6.1 4.0 - 10.5 K/uL   RBC 3.94 3.87 - 5.11 MIL/uL   Hemoglobin 12.2 12.0 - 15.0 g/dL   HCT 91.437.4 78.236.0 - 95.646.0 %   MCV 94.9 78.0 - 100.0 fL   MCH 31.0 26.0 - 34.0 pg   MCHC 32.6 30.0 - 36.0 g/dL   RDW 21.313.4 08.611.5 - 57.815.5 %   Platelets 387 150 -  400 K/uL  Hemoglobin and hematocrit, blood  Result Value Ref Range   Hemoglobin 9.1 (L) 12.0 - 15.0 g/dL   HCT 46.929.3 (L) 62.936.0 - 52.846.0 %  Basic metabolic panel  Result Value Ref Range   Sodium 139 135 - 145 mmol/L   Potassium 4.2 3.5 - 5.1 mmol/L   Chloride 105 101 - 111 mmol/L   CO2 27 22 - 32 mmol/L   Glucose, Bld 110 (H) 65 - 99 mg/dL   BUN 12 6 - 20 mg/dL   Creatinine, Ser 4.130.91 0.44 - 1.00 mg/dL   Calcium 8.5 (L) 8.9 - 10.3 mg/dL   GFR calc non Af Amer >60 >60 mL/min   GFR calc Af Amer >60 >60 mL/min   Anion gap 7 5 - 15    Discharge Medications:     Medication List    STOP taking these medications        RA NAPROXEN SODIUM 220 MG tablet  Generic drug:  naproxen sodium     rosuvastatin 40 MG tablet  Commonly known as:  CRESTOR     zolpidem 5 MG tablet  Commonly known as:  AMBIEN      TAKE these medications  aspirin EC 81 MG tablet  Take 81 mg by mouth daily before lunch.     atorvastatin 40 MG tablet  Commonly known as:  LIPITOR  Take 40 mg by mouth at bedtime.     cetirizine 10 MG tablet  Commonly known as:  ZYRTEC  Take 10 mg by mouth daily.     clopidogrel 75 MG tablet  Commonly known as:  PLAVIX  Take 75 mg by mouth daily.     conjugated estrogens vaginal cream  Commonly known as:  PREMARIN  Place 1 Applicatorful vaginally 3 (three) times a week.     cycloSPORINE 0.05 % ophthalmic emulsion  Commonly known as:  RESTASIS  Place 1 drop into both eyes 2 (two) times daily.     DULoxetine 60 MG capsule  Commonly known as:  CYMBALTA  Take 60 mg by mouth every morning.     furosemide 20 MG tablet  Commonly known as:  LASIX  Take 20 mg by mouth daily as needed for fluid.     gabapentin 300 MG capsule  Commonly known as:  NEURONTIN  Take 300 mg by mouth at bedtime.     isosorbide mononitrate 30 MG 24 hr tablet  Commonly known as:  IMDUR  Take 30 mg by mouth daily.     lisinopril 5 MG tablet  Commonly known as:  PRINIVIL,ZESTRIL  Take 5  mg by mouth daily.     methocarbamol 500 MG tablet  Commonly known as:  ROBAXIN  Take 1 tablet (500 mg total) by mouth 3 (three) times daily as needed.     metoprolol 50 MG tablet  Commonly known as:  LOPRESSOR  Take 50 mg by mouth 2 (two) times daily.     mometasone 50 MCG/ACT nasal spray  Commonly known as:  NASONEX  Place 1 spray into the nose daily as needed (allergies.).     multivitamin tablet  Take 1 tablet by mouth daily.     nitroGLYCERIN 0.4 MG SL tablet  Commonly known as:  NITROSTAT  Place 1 tablet under the tongue every 5 (five) minutes x 3 doses as needed for chest pain.     oxyCODONE-acetaminophen 5-325 MG tablet  Commonly known as:  PERCOCET/ROXICET  Take 1 tablet by mouth every 6 (six) hours as needed for severe pain.     oxyCODONE-acetaminophen 5-325 MG tablet  Commonly known as:  ROXICET  Take 1-2 tablets by mouth every 4 (four) hours as needed for severe pain.     pramipexole 0.5 MG tablet  Commonly known as:  MIRAPEX  Take 1 tablet by mouth at bedtime.     RANEXA 500 MG 12 hr tablet  Generic drug:  ranolazine  Take 500 mg by mouth 2 (two) times daily.     STOOL SOFTENER 100 MG capsule  Generic drug:  Docusate Sodium  Take 100 mg by mouth 2 (two) times daily.     SYNTHROID 75 MCG tablet  Generic drug:  levothyroxine  Take 75 mcg by mouth daily before breakfast.     traMADol 50 MG tablet  Commonly known as:  ULTRAM  Take 50 mg by mouth 3 (three) times daily as needed for moderate pain.        Diagnostic Studies: Dg Elbow Complete Left  22-Jul-2015  CLINICAL DATA:  Pt c/o shoulder pain s/p fall yesterday evening in her driveway at home, slipped, fall. Pt denies elbow pain, best images obtainable due to pt's pain. Humerus images obtained concurrently, with  ap elbow displayed. EXAM: LEFT ELBOW - COMPLETE 3+ VIEW COMPARISON:  None. FINDINGS: Nonstandard positioning. There is no evidence of fracture, dislocation, or joint effusion. There is no evidence  of arthropathy or other focal bone abnormality. Soft tissues are unremarkable. IMPRESSION: Negative. Electronically Signed   By: Corlis Leak M.D.   On: 07/10/2015 10:13   Dg Shoulder Left  07/10/2015  CLINICAL DATA:  Pain after fall EXAM: LEFT SHOULDER - 2+ VIEW COMPARISON:  None. FINDINGS: There is a comminuted fracture of the left humeral head and neck. The lack of an axillary or transscapular view limits evaluation for dislocation but none is seen. Degenerative changes seen in the shoulder. No other acute abnormalities. IMPRESSION: Comminuted fracture of the left humeral head and neck. Evaluation for dislocation is limited but none is seen. Electronically Signed   By: Gerome Sam III M.D   On: 07/10/2015 10:11   Dg Shoulder Left Port  07/23/2015  CLINICAL DATA:  Post left shoulder replacement. EXAM: LEFT SHOULDER - 1 VIEW COMPARISON:  Humerus radiograph 07/10/2015 FINDINGS: Reverse total left shoulder arthroplasty in expected alignment. Humeral shaft is midline. Fragmentation about the lateral aspect of the humeral prosthesis may be secondary to humeral fracture as seen on radiographs from 07/10/2015. There is an os acromial. IMPRESSION: Reverse total left shoulder arthroplasty without immediate postoperative complication. Electronically Signed   By: Rubye Oaks M.D.   On: 07/23/2015 19:43   Dg Humerus Left  07/10/2015  CLINICAL DATA:  Pain after fall EXAM: LEFT HUMERUS - 2+ VIEW COMPARISON:  None. FINDINGS: The humeral head and neck fracture is again identified. The remainder of the humerus is intact. The proximal radius and ulna are remarkable on limited views. IMPRESSION: Right humeral head and neck fracture.  No other abnormalities. Electronically Signed   By: Gerome Sam III M.D   On: 07/10/2015 10:12    Disposition: 01-Home or Self Care        Follow-up Information    Follow up with Ruthmary Occhipinti,STEVEN R, MD. Call in 2 weeks.   Specialty:  Orthopedic Surgery   Why:  [phone]    Contact information:   [address] [place] [address] [phone]        Signed: Verlee Rossetti 07/25/2015, 9:35 AM

## 2015-07-25 NOTE — Progress Notes (Signed)
Physical Therapy Treatment Patient Details Name: Tracey Roberts MRN: [id number] DOB: May 19, 1940 Today's Date: 07/25/2015    History of Present Illness Pt is a 75 y.o. female s/p LEFT REVERSE SHOULDER ARTHROPLASTY, OS ACROMIALE REPAIR/OPEN REDUCTION INTERNAL FIXATION LEFT SHOULDER. PMHx: Peripheral vascular disease, CAD, Hypercholesterolemia, Arithritis, Humerus fx.     PT Comments    Pt making progress with mobility during PT sessions. Pt continues to have mild instability during ambulation. This was discussed with the patient and her family (spouse/son) and recommended that assistance be provided during ambulation and transfers. Pt and family verbalize understanding and deny any questions or concerns.   Follow Up Recommendations  Home health PT;Supervision for mobility/OOB     Equipment Recommendations  None recommended by PT    Recommendations for Other Services       Precautions / Restrictions Precautions Precautions: Fall;Shoulder Type of Shoulder Precautions: Conservative protocol: NO AROM/PROM shoulder. AROM elbow, wrist, hand OK. NO pendulums. Shoulder Interventions: Shoulder sling/immobilizer;At all times;Off for dressing/bathing/exercises Precaution Comments: Reviewed precautions with pt and son. Required Braces or Orthoses: Sling Restrictions Weight Bearing Restrictions: Yes LUE Weight Bearing: Non weight bearing    Mobility  Bed Mobility               General bed mobility comments: up in chair upon arrival  Transfers Overall transfer level: Needs assistance Equipment used: None Transfers: Sit to/from Stand Sit to Stand: Supervision         General transfer comment: supervision for safety  Ambulation/Gait Ambulation/Gait assistance: Min guard Ambulation Distance (Feet): 230 Feet Assistive device: None Gait Pattern/deviations: Step-through pattern Gait velocity: decreased   General Gait Details: Pt with intermittant loss of balance with  independent recovery. Pt refusing to use cane to assist with balance.    Stairs Stairs: Yes Stairs assistance: Min guard Stair Management: One rail Right;Alternating pattern;Forwards Number of Stairs: 12 General stair comments: pt reports feeling confident with stairs. Recommended assist for safety and pt verbalized understanding.   Wheelchair Mobility    Modified Rankin (Stroke Patients Only)       Balance Overall balance assessment: Needs assistance Sitting-balance support: No upper extremity supported Sitting balance-Leahy Scale: Good     Standing balance support: No upper extremity supported Standing balance-Leahy Scale: Fair Standing balance comment: occasional loss of balance with ambulation                    Cognition Arousal/Alertness: Awake/alert Behavior During Therapy: WFL for tasks assessed/performed Overall Cognitive Status: Within Functional Limits for tasks assessed                      Exercises Shoulder Exercises Elbow Flexion: AAROM;Left;10 reps;Seated Wrist Flexion: AROM;Left;10 reps;Seated Digit Composite Flexion: AROM;Left;10 reps;Seated Donning/doffing shirt without moving shoulder: Minimal assistance;Patient able to independently direct caregiver Method for sponge bathing under operated UE: Minimal assistance;Patient able to independently direct caregiver Donning/doffing sling/immobilizer: Minimal assistance;Patient able to independently direct caregiver Correct positioning of sling/immobilizer: Supervision/safety ROM for elbow, wrist and digits of operated UE: Minimal assistance;Patient able to independently direct caregiver Sling wearing schedule (on at all times/off for ADL's): Supervision/safety Proper positioning of operated UE when showering: Supervision/safety Positioning of UE while sleeping: Minimal assistance;Patient able to independently direct caregiver    General Comments General comments (skin integrity, edema,  etc.): Recommended up with assistance at home due to instability with ambulation. Pt and family in agreement. Also educated on fall prevention with footwear, lighting and no throw rugs.  Pertinent Vitals/Pain Pain Assessment: Faces Pain Score: 8  Faces Pain Scale: Hurts little more Pain Location: Lt shoulder Pain Descriptors / Indicators: Guarding Pain Intervention(s): Limited activity within patient's tolerance;Monitored during session;Ice applied    Home Living                      Prior Function            PT Goals (current goals can now be found in the care plan section) Acute Rehab PT Goals Patient Stated Goal: be active again PT Goal Formulation: With patient Time For Goal Achievement: 08/07/15 Potential to Achieve Goals: Good Progress towards PT goals: Progressing toward goals    Frequency  Min 3X/week    PT Plan Current plan remains appropriate    Co-evaluation             End of Session Equipment Utilized During Treatment: Gait belt;Other (comment) (sling) Activity Tolerance: Patient tolerated treatment well Patient left: in chair;with call bell/phone within reach;with family/visitor present     Time: 1610-96041002-1023 PT Time Calculation (min) (ACUTE ONLY): 21 min  Charges:  $Gait Training: 8-22 mins                    G Codes:      Christiane HaBenjamin J. Lama Narayanan, PT, CSCS Pager [phone] Office [phone]  07/25/2015, 11:17 AM

## 2015-07-25 NOTE — Progress Notes (Signed)
Occupational Therapy Treatment Patient Details Name: Tracey Roberts MRN: [id number] DOB: [birth year] Today's Date: 07/25/2015    History of present illness Pt is a 75 y.o. female s/p LEFT REVERSE SHOULDER ARTHROPLASTY, OS ACROMIALE REPAIR/OPEN REDUCTION INTERNAL FIXATION LEFT SHOULDER. PMHx: Peripheral vascular disease, CAD, Hypercholesterolemia, Arithritis, Humerus fx.    OT comments  Pt making progress toward OT goals this session. Continues to require min guard for safety with functional mobility. Pt tolerating LUE exercises better today; decreased numbness and increased AROM. Pt able to teach back shoulder and ADL education provided by OT yesterday. D/c plan remains appropriate. Pt OK to d/c from OT standpoint at this time but will continue to follow acutely.   Follow Up Recommendations  Other (comment) (follow up per MD)    Equipment Recommendations  None recommended by OT    Recommendations for Other Services      Precautions / Restrictions Precautions Precautions: Fall;Shoulder Type of Shoulder Precautions: Conservative protocol: NO AROM/PROM shoulder. AROM elbow, wrist, hand OK. NO pendulums. Shoulder Interventions: Shoulder sling/immobilizer;At all times;Off for dressing/bathing/exercises Precaution Comments: Reviewed precautions with pt and son. Required Braces or Orthoses: Sling Restrictions Weight Bearing Restrictions: Yes LUE Weight Bearing: Non weight bearing       Mobility Bed Mobility               General bed mobility comments: Pt OOB in chair upon arrival.  Transfers Overall transfer level: Needs assistance Equipment used: None Transfers: Sit to/from Stand Sit to Stand: Min guard         General transfer comment: Min guard for safety; no physical assist required. Good hand placement and technique. Sit to stand from chair x 1, toilet x 1.    Balance Overall balance assessment: Needs assistance Sitting-balance support: Feet supported;No upper  extremity supported Sitting balance-Leahy Scale: Good     Standing balance support: No upper extremity supported;During functional activity Standing balance-Leahy Scale: Fair                     ADL Overall ADL's : Needs assistance/impaired     Grooming: Min guard;Standing;Wash/dry hands;Oral care                   Toilet Transfer: Min guard;Ambulation;Comfort height toilet;Grab bars   Toileting- Clothing Manipulation and Hygiene: Min guard;Sitting/lateral lean Toileting - Clothing Manipulation Details (indicate cue type and reason): for toilet hygiene     Functional mobility during ADLs: Min guard General ADL Comments: Reviewed LUE exercises (elbow, wrist, hand). Pt with increased tolerance and AROM. Pt reports numbness is better but not completely resolved; still numb in L thumb.      Vision                     Perception     Praxis      Cognition   Behavior During Therapy: [facility] for tasks assessed/performed Overall Cognitive Status: Within Functional Limits for tasks assessed                       Extremity/Trunk Assessment               Exercises Shoulder Exercises Elbow Flexion: AAROM;Left;10 reps;Seated Wrist Flexion: AROM;Left;10 reps;Seated Digit Composite Flexion: AROM;Left;10 reps;Seated Donning/doffing shirt without moving shoulder: Minimal assistance;Patient able to independently direct caregiver Method for sponge bathing under operated UE: Minimal assistance;Patient able to independently direct caregiver Donning/doffing sling/immobilizer: Minimal assistance;Patient able to independently direct caregiver Correct positioning  of sling/immobilizer: Supervision/safety ROM for elbow, wrist and digits of operated UE: Minimal assistance;Patient able to independently direct caregiver Sling wearing schedule (on at all times/off for ADL's): Supervision/safety Proper positioning of operated UE when showering:  Supervision/safety Positioning of UE while sleeping: Minimal assistance;Patient able to independently direct caregiver   Shoulder Instructions Shoulder Instructions Donning/doffing shirt without moving shoulder: Minimal assistance;Patient able to independently direct caregiver Method for sponge bathing under operated UE: Minimal assistance;Patient able to independently direct caregiver Donning/doffing sling/immobilizer: Minimal assistance;Patient able to independently direct caregiver Correct positioning of sling/immobilizer: Supervision/safety ROM for elbow, wrist and digits of operated UE: Minimal assistance;Patient able to independently direct caregiver Sling wearing schedule (on at all times/off for ADL's): Supervision/safety Proper positioning of operated UE when showering: Supervision/safety Positioning of UE while sleeping: Minimal assistance;Patient able to independently direct caregiver     General Comments      Pertinent Vitals/ Pain       Pain Assessment: 0-10 Pain Score: 8  Pain Location: L shoulder Pain Descriptors / Indicators: Aching;Sore;Grimacing;Operative site guarding Pain Intervention(s): Limited activity within patient's tolerance;Monitored during session;Premedicated before session;Ice applied  Home Living                                          Prior Functioning/Environment              Frequency Min 2X/week     Progress Toward Goals  OT Goals(current goals can now be found in the care plan section)  Progress towards OT goals: Progressing toward goals  Acute Rehab OT Goals Patient Stated Goal: get back home  Plan Discharge plan remains appropriate    Co-evaluation                 End of Session Equipment Utilized During Treatment: Other (comment) (sling)   Activity Tolerance Patient tolerated treatment well   Patient Left in chair;with call bell/phone within reach;with family/visitor present   Nurse Communication  Other (comment) (pt OK to d/c from OT standpoint)        Time: [id number] OT Time Calculation (min): 16 min  Charges: OT General Charges $OT Visit: 1 Procedure OT Treatments $Therapeutic Exercise: 8-22 mins  Gaye AlkenBailey A Nabor Thomann M.S., OTR/L Pager: [phone]  07/25/2015, 10:09 AM

## 2015-07-26 ENCOUNTER — Encounter (HOSPITAL_COMMUNITY): Payer: Self-pay | Admitting: Orthopedic Surgery

## 2015-08-12 ENCOUNTER — Other Ambulatory Visit (HOSPITAL_COMMUNITY): Payer: Self-pay

## 2015-11-28 ENCOUNTER — Emergency Department (HOSPITAL_COMMUNITY)
Admission: EM | Admit: 2015-11-28 | Discharge: 2015-11-28 | Disposition: A | Discharge status: Home / Self Care | Payer: Medicare Other | Attending: Emergency Medicine | Admitting: Emergency Medicine

## 2015-11-28 ENCOUNTER — Encounter (HOSPITAL_COMMUNITY): Payer: Self-pay | Admitting: Emergency Medicine

## 2015-11-28 ENCOUNTER — Emergency Department (HOSPITAL_COMMUNITY): Payer: Medicare Other

## 2015-11-28 DIAGNOSIS — S8992XA Unspecified injury of left lower leg, initial encounter: Secondary | ICD-10-CM | POA: Diagnosis not present

## 2015-11-28 DIAGNOSIS — Y939 Activity, unspecified: Secondary | ICD-10-CM | POA: Insufficient documentation

## 2015-11-28 DIAGNOSIS — Y929 Unspecified place or not applicable: Secondary | ICD-10-CM | POA: Diagnosis not present

## 2015-11-28 DIAGNOSIS — I2510 Atherosclerotic heart disease of native coronary artery without angina pectoris: Secondary | ICD-10-CM | POA: Insufficient documentation

## 2015-11-28 DIAGNOSIS — Y999 Unspecified external cause status: Secondary | ICD-10-CM | POA: Insufficient documentation

## 2015-11-28 DIAGNOSIS — Z7982 Long term (current) use of aspirin: Secondary | ICD-10-CM | POA: Insufficient documentation

## 2015-11-28 DIAGNOSIS — W010XXA Fall on same level from slipping, tripping and stumbling without subsequent striking against object, initial encounter: Secondary | ICD-10-CM | POA: Insufficient documentation

## 2015-11-28 DIAGNOSIS — E039 Hypothyroidism, unspecified: Secondary | ICD-10-CM | POA: Diagnosis not present

## 2015-11-28 NOTE — ED Provider Notes (Signed)
[facility]-EMERGENCY DEPT Provider Note   CSN: [id number] Arrival date & time: 11/28/15  1331  By signing my name below, I, Doreatha MartinEva Mathews, attest that this documentation has been prepared under the direction and in the presence of Shawn Joy, PA-C. Electronically Signed: Doreatha MartinEva Mathews, ED Scribe. 11/28/15. 1:55 PM.    History   Chief Complaint Chief Complaint  Patient presents with  . Knee Injury    HPI Tracey Roberts is a 75 y.o. female with h/o left partial knee replacement by Dr. Tenny Crawoss with [facility] who presents to the Emergency Department complaining of moderate left knee swelling onset this morning s/p stumble and mechanical fall. Pt states she stepped between two chairs, stumbled and fell forward onto her left knee. She denies LOC or head injury. Pt states she is not experiencing knee pain, and only notes some pressure secondary to the swelling. She is able to bear weight on the knee and ambulate without difficulty. Pt takes daily Plavix. She denies numbness, CP, SOB, neck pain, back pain, additional complaints or injuries.   The history is provided by the patient. No language interpreter was used.    Past Medical History:  Diagnosis Date  . Arthritis   . Coronary artery disease   . Headache    migraines  . Humerus fracture    head and neck  . Hypercholesterolemia   . Hypothyroidism   . Insomnia   . Peripheral vascular disease (HCC)   . Seasonal allergies     Patient Active Problem List   Diagnosis Date Noted  . 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 ABDOMINAL HYSTERECTOMY    . APPENDECTOMY    . CHOLECYSTECTOMY    . COLONOSCOPY W/ BIOPSIES AND POLYPECTOMY    . CORONARY ANGIOPLASTY WITH STENT PLACEMENT    . DILATION AND CURETTAGE OF UTERUS    . FRACTURE SURGERY     left foot  . JOINT REPLACEMENT     right knee  . MEDIAL PARTIAL KNEE REPLACEMENT     left  . MULTIPLE TOOTH EXTRACTIONS    . REVERSE  SHOULDER ARTHROPLASTY Left 07/23/2015   Procedure: LEFT REVERSE SHOULDER ARTHROPLASTY, OS ACROMIALE REPAIR/OPEN REDUCTION INTERNAL FIXATION LEFT SHOULDER ;  Surgeon: Beverely LowSteve Norris, MD;  Location: [facility] OR;  Service: Orthopedics;  Laterality: Left;  . TONSILLECTOMY    . TUBAL LIGATION      OB History    No data available       Home Medications    Prior to Admission medications   Medication Sig Start Date End Date Taking? Authorizing Provider  aspirin EC 81 MG tablet Take 81 mg by mouth daily before lunch.    Historical Provider, MD  atorvastatin (LIPITOR) 40 MG tablet Take 40 mg by mouth at bedtime.    Historical Provider, MD  cetirizine (ZYRTEC) 10 MG tablet Take 10 mg by mouth daily. 01/10/10   Historical Provider, MD  clopidogrel (PLAVIX) 75 MG tablet Take 75 mg by mouth daily. 01/10/10   Historical Provider, MD  conjugated estrogens (PREMARIN) vaginal cream Place 1 Applicatorful vaginally 3 (three) times a week.    Historical Provider, MD  cycloSPORINE (RESTASIS) 0.05 % ophthalmic emulsion Place 1 drop into both eyes 2 (two) times daily.    Historical Provider, MD  Docusate Sodium (STOOL SOFTENER) 100 MG capsule Take 100 mg by mouth 2 (two) times daily. 01/11/10   Historical Provider, MD  DULoxetine (CYMBALTA) 60 MG capsule  Take 60 mg by mouth every morning. 01/10/10   Historical Provider, MD  furosemide (LASIX) 20 MG tablet Take 20 mg by mouth daily as needed for fluid.     Historical Provider, MD  gabapentin (NEURONTIN) 300 MG capsule Take 300 mg by mouth at bedtime. 01/10/10   Historical Provider, MD  isosorbide mononitrate (IMDUR) 30 MG 24 hr tablet Take 30 mg by mouth daily. 01/10/10   Historical Provider, MD  levothyroxine (SYNTHROID) 75 MCG tablet Take 75 mcg by mouth daily before breakfast.  08/11/10   Historical Provider, MD  lisinopril (PRINIVIL,ZESTRIL) 5 MG tablet Take 5 mg by mouth daily. 04/17/15   Historical Provider, MD  methocarbamol (ROBAXIN) 500 MG tablet Take 1 tablet  (500 mg total) by mouth 3 (three) times daily as needed. 07/23/15   Beverely Low, MD  metoprolol (LOPRESSOR) 50 MG tablet Take 50 mg by mouth 2 (two) times daily. 04/17/15   Historical Provider, MD  mometasone (NASONEX) 50 MCG/ACT nasal spray Place 1 spray into the nose daily as needed (allergies.).     Historical Provider, MD  Multiple Vitamin (MULTIVITAMIN) tablet Take 1 tablet by mouth daily. 01/10/10   Historical Provider, MD  nitroGLYCERIN (NITROSTAT) 0.4 MG SL tablet Place 1 tablet under the tongue every 5 (five) minutes x 3 doses as needed for chest pain.  01/28/15   Historical Provider, MD  oxyCODONE-acetaminophen (PERCOCET/ROXICET) 5-325 MG tablet Take 1 tablet by mouth every 6 (six) hours as needed for severe pain. 07/10/15   Renne Crigler, PA-C  oxyCODONE-acetaminophen (ROXICET) 5-325 MG tablet Take 1-2 tablets by mouth every 4 (four) hours as needed for severe pain. 07/23/15   Beverely Low, MD  pramipexole (MIRAPEX) 0.5 MG tablet Take 1 tablet by mouth at bedtime. 04/19/15   Historical Provider, MD  RANEXA 500 MG 12 hr tablet Take 500 mg by mouth 2 (two) times daily. 04/17/15   Historical Provider, MD  traMADol (ULTRAM) 50 MG tablet Take 50 mg by mouth 3 (three) times daily as needed for moderate pain.     Historical Provider, MD    Family History Family History  Problem Relation Age of Onset  . Heart disease Other     Social History Social History  Substance Use Topics  . Smoking status: Never Smoker  . Smokeless tobacco: Never Used  . Alcohol use No     Allergies   Review of patient's allergies indicates no known allergies.   Review of Systems Review of Systems  Respiratory: Negative for shortness of breath.   Cardiovascular: Negative for chest pain.  Musculoskeletal: Positive for joint swelling. Negative for arthralgias, back pain and neck pain.  Neurological: Negative for syncope and numbness.    Physical Exam Updated Vital Signs BP 134/75 (BP Location: Left Arm)    Pulse 78   Temp 98.2 F (36.8 C) (Oral)   Resp 16   SpO2 96%   Physical Exam  Constitutional: She appears well-developed and well-nourished. No distress.  HENT:  Head: Normocephalic and atraumatic.  Eyes: Conjunctivae are normal.  Neck: Neck supple.  Cardiovascular: Normal rate and regular rhythm.   Pulmonary/Chest: Effort normal. No respiratory distress.  Abdominal: She exhibits no distension.  Musculoskeletal: Normal range of motion. She exhibits edema and tenderness.  Significant swelling to anterior left knee. The swelling is soft to palpation. Slight tenderness to the medial side. FROM in the left knee and ankle.   Neurological: She is alert.  Noted to ambulate without assistance or difficulty. No sensory deficits.  Strength in the lower extremities is 5/5.   Skin: Skin is warm and dry. She is not diaphoretic.  Psychiatric: She has a normal mood and affect. Her behavior is normal.  Nursing note and vitals reviewed.    ED Treatments / Results  Labs (all labs ordered are listed, but only abnormal results are displayed) Labs Reviewed - No data to display  EKG  EKG Interpretation None       Radiology Dg Knee Complete 4 Views Left  Result Date: 11/28/2015 CLINICAL DATA:  Diffuse left knee pain, swelling.  Fall today. EXAM: LEFT KNEE - COMPLETE 4+ VIEW COMPARISON:  None. FINDINGS: Changes of prior medial compartment hemiarthroplasty. Marked anterior soft tissue swelling. Suspect small to moderate joint effusion. No acute bony abnormality. No fracture, subluxation or dislocation. IMPRESSION: Prior medial hemiarthroplasty. Marked anterior soft tissue swelling and joint effusion. No acute bony abnormality. Electronically Signed   By: Charlett Nose M.D.   On: 11/28/2015 14:30    Procedures Procedures (including critical care time)  DIAGNOSTIC STUDIES: Oxygen Saturation is 96% on RA, adequate by my interpretation.    COORDINATION OF CARE: 1:55 PM Discussed treatment plan  with pt at bedside which includes XR and pt agreed to plan.    Medications Ordered in ED Medications - No data to display   Initial Impression / Assessment and Plan / ED Course  I have reviewed the triage vital signs and the nursing notes.  Pertinent labs & imaging results that were available during my care of the patient were reviewed by me and considered in my medical decision making (see chart for details).  Clinical Course     Findings and plan of care discussed with Raeford Razor, MD.   Patient has full range of motion and can bear weight without difficulty. Effusion noted on x-ray, but without any other abnormalities. Patient to follow-up with orthopedics. Ace wrap applied.    Final Clinical Impressions(s) / ED Diagnoses   Final diagnoses:  Knee injury, left, initial encounter    New Prescriptions Discharge Medication List as of 11/28/2015  2:39 PM      I personally performed the services described in this documentation, which was scribed in my presence. The recorded information has been reviewed and is accurate.    Anselm Pancoast, PA-C 11/28/15 1814    Raeford Razor, MD 12/09/15 [phone]

## 2015-11-28 NOTE — ED Triage Notes (Signed)
Pt c/o significant left knee edema onset today at 0900 after falling. On blood thinners. No pain. 2+ pedal pulse. No other injury.

## 2015-11-28 NOTE — ED Notes (Signed)
Pt transferred to x ray

## 2015-11-28 NOTE — Discharge Instructions (Signed)
You have been seen today for knee swelling after a fall. Your imaging showed no abnormalities other than swelling. Follow-up with orthopedics as soon as possible. Return to ED should symptoms worsen.

## 2017-10-16 IMAGING — CR DG SHOULDER 2+V*L*
2 series · 2 of 2 positions shown · non-contrast
Comparison: None.

CLINICAL DATA: Pain after fall

EXAM:
LEFT SHOULDER - 2+ VIEW

[w shoulder internal left]
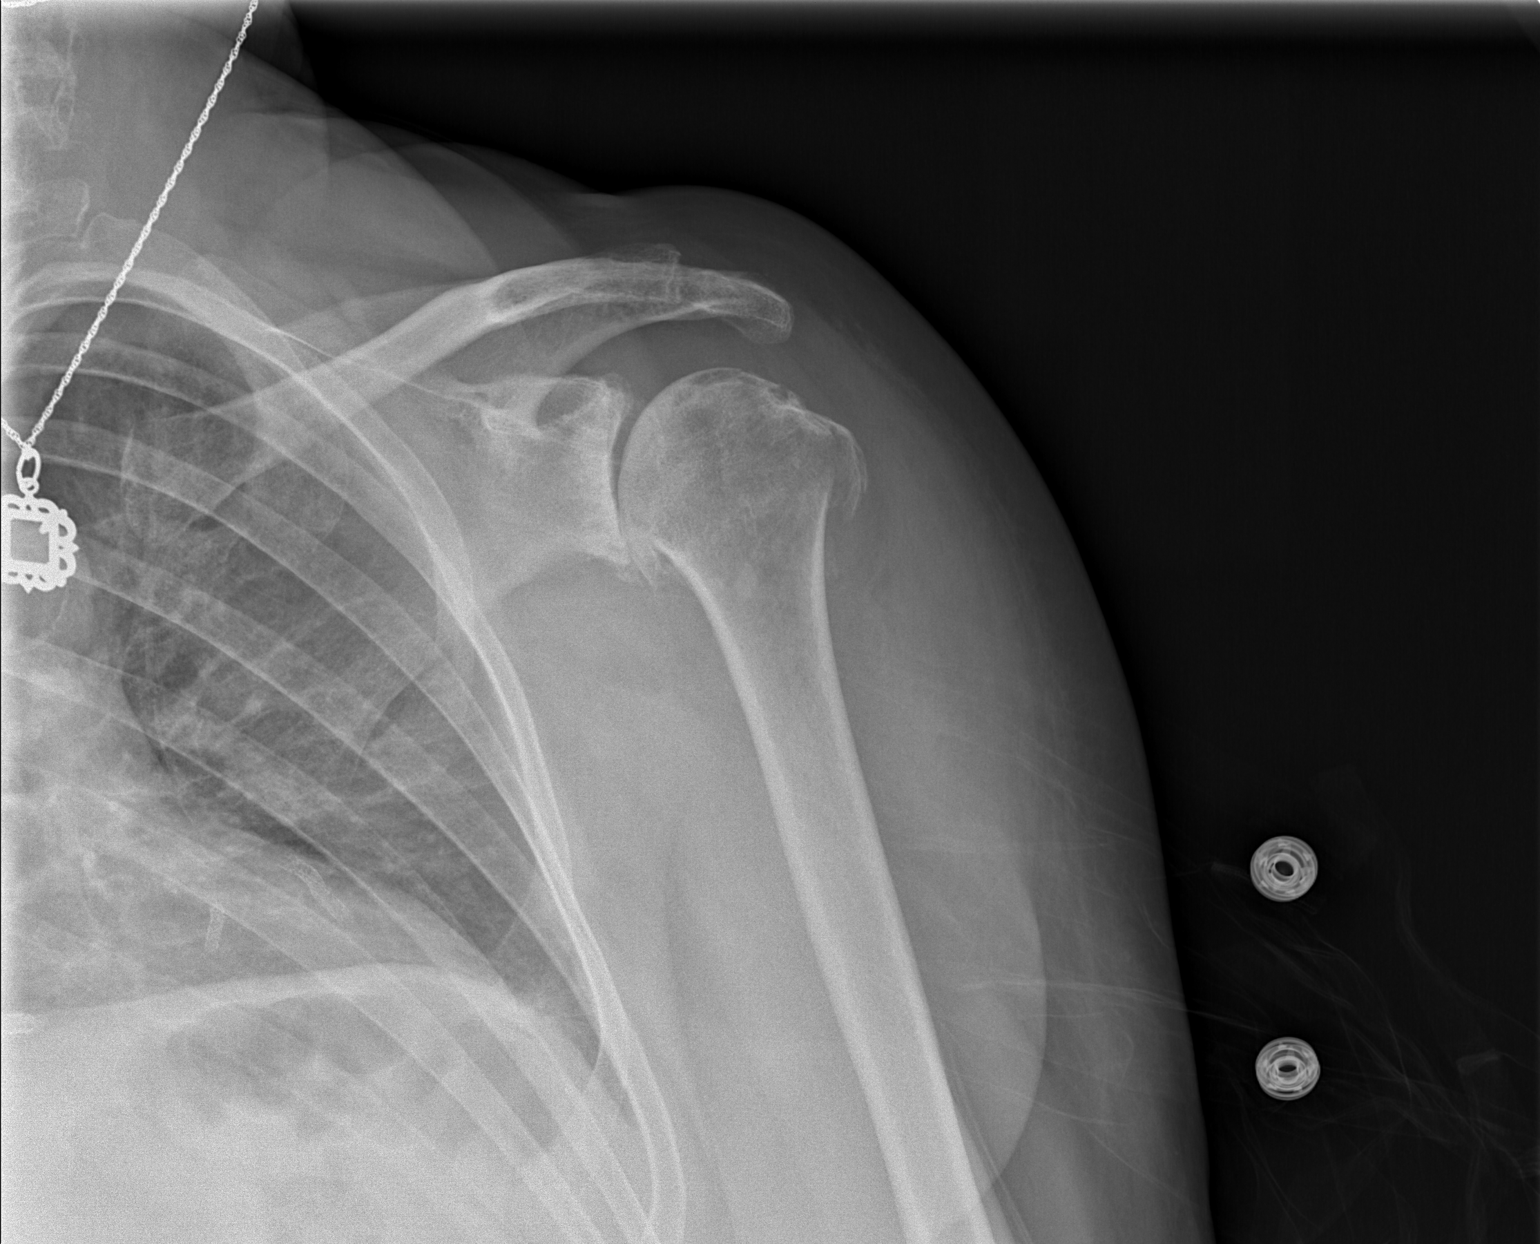

[w shoulder axillary left]
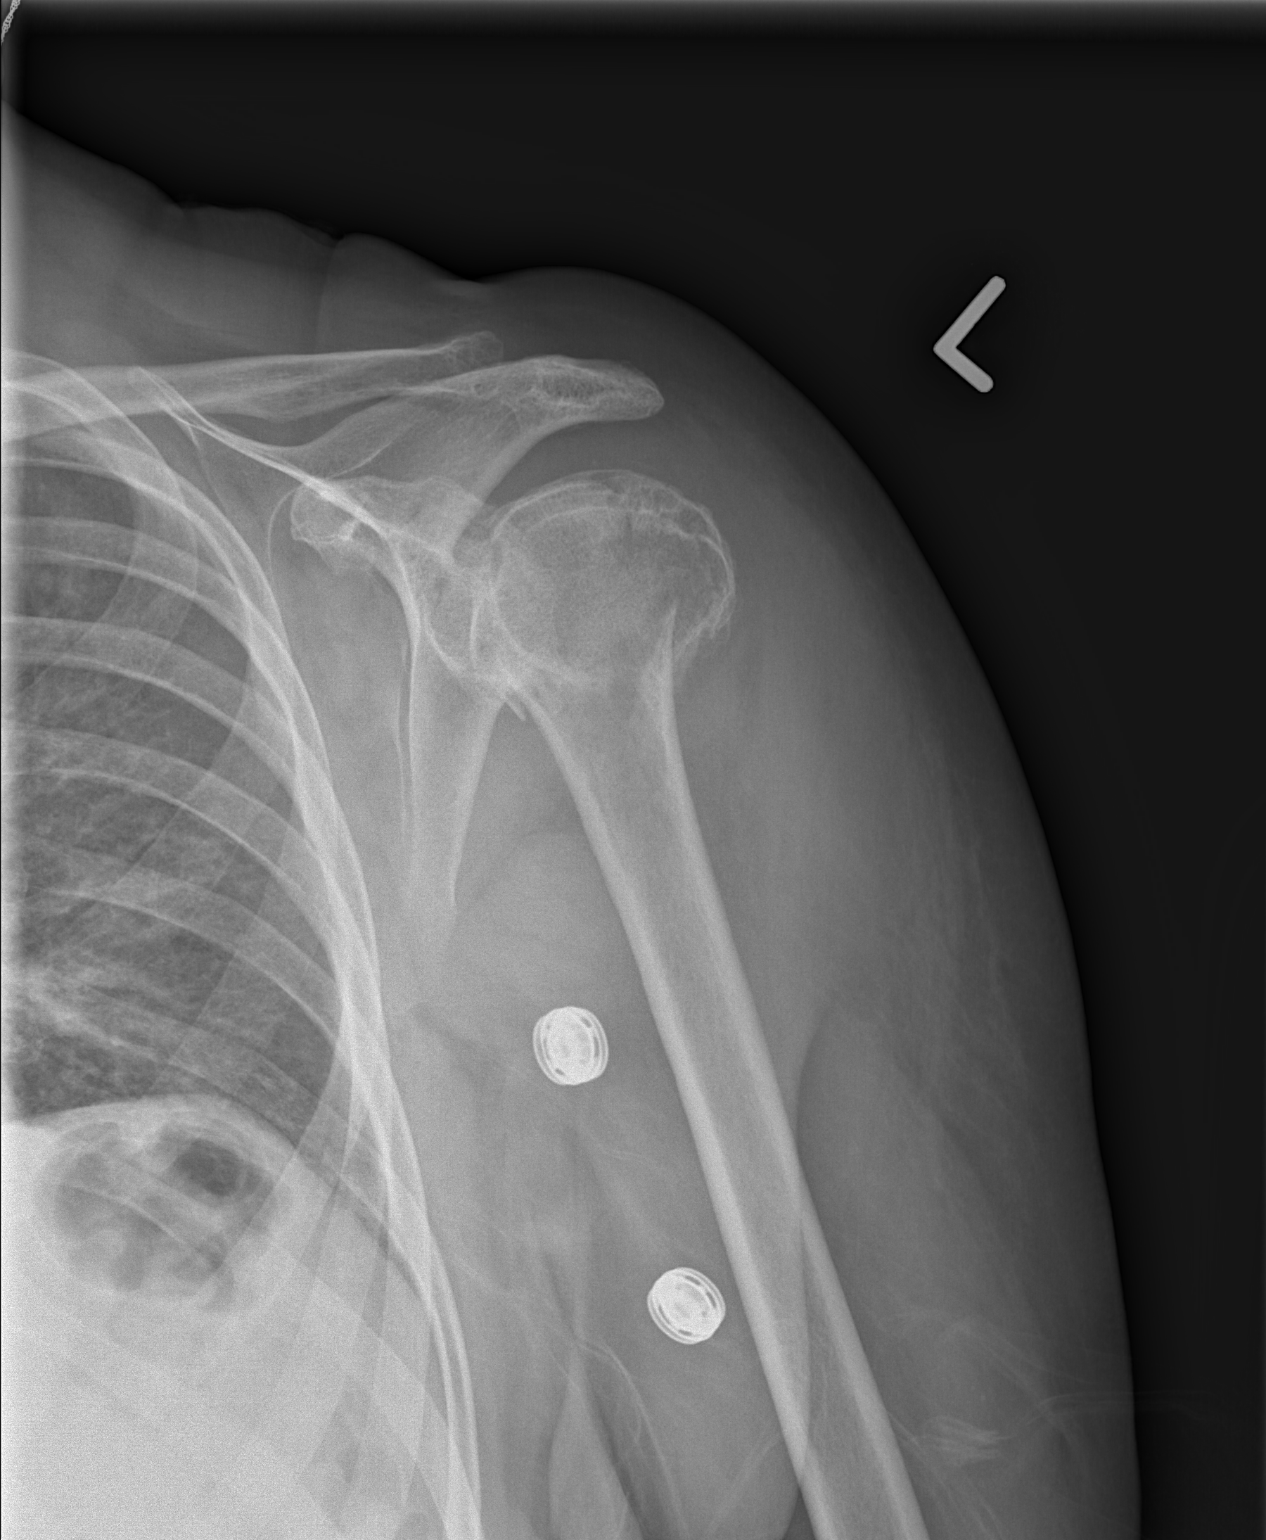

[2 of 2 positions shown; findings below may reference images not displayed]

FINDINGS: There is a comminuted fracture of the left humeral head and neck.
The lack of an axillary or transscapular view limits evaluation for
dislocation but none is seen. Degenerative changes seen in the
shoulder. No other acute abnormalities.
IMPRESSION: Comminuted fracture of the left humeral head and neck. Evaluation
for dislocation is limited but none is seen.

## 2017-10-29 IMAGING — CR DG SHOULDER 1V*L*
2 series · 2 of 2 positions shown · non-contrast
Comparison: Humerus radiograph 07/10/2015

CLINICAL DATA: Post left shoulder replacement.

EXAM:
LEFT SHOULDER - 1 VIEW

[AP (1 of 2)]
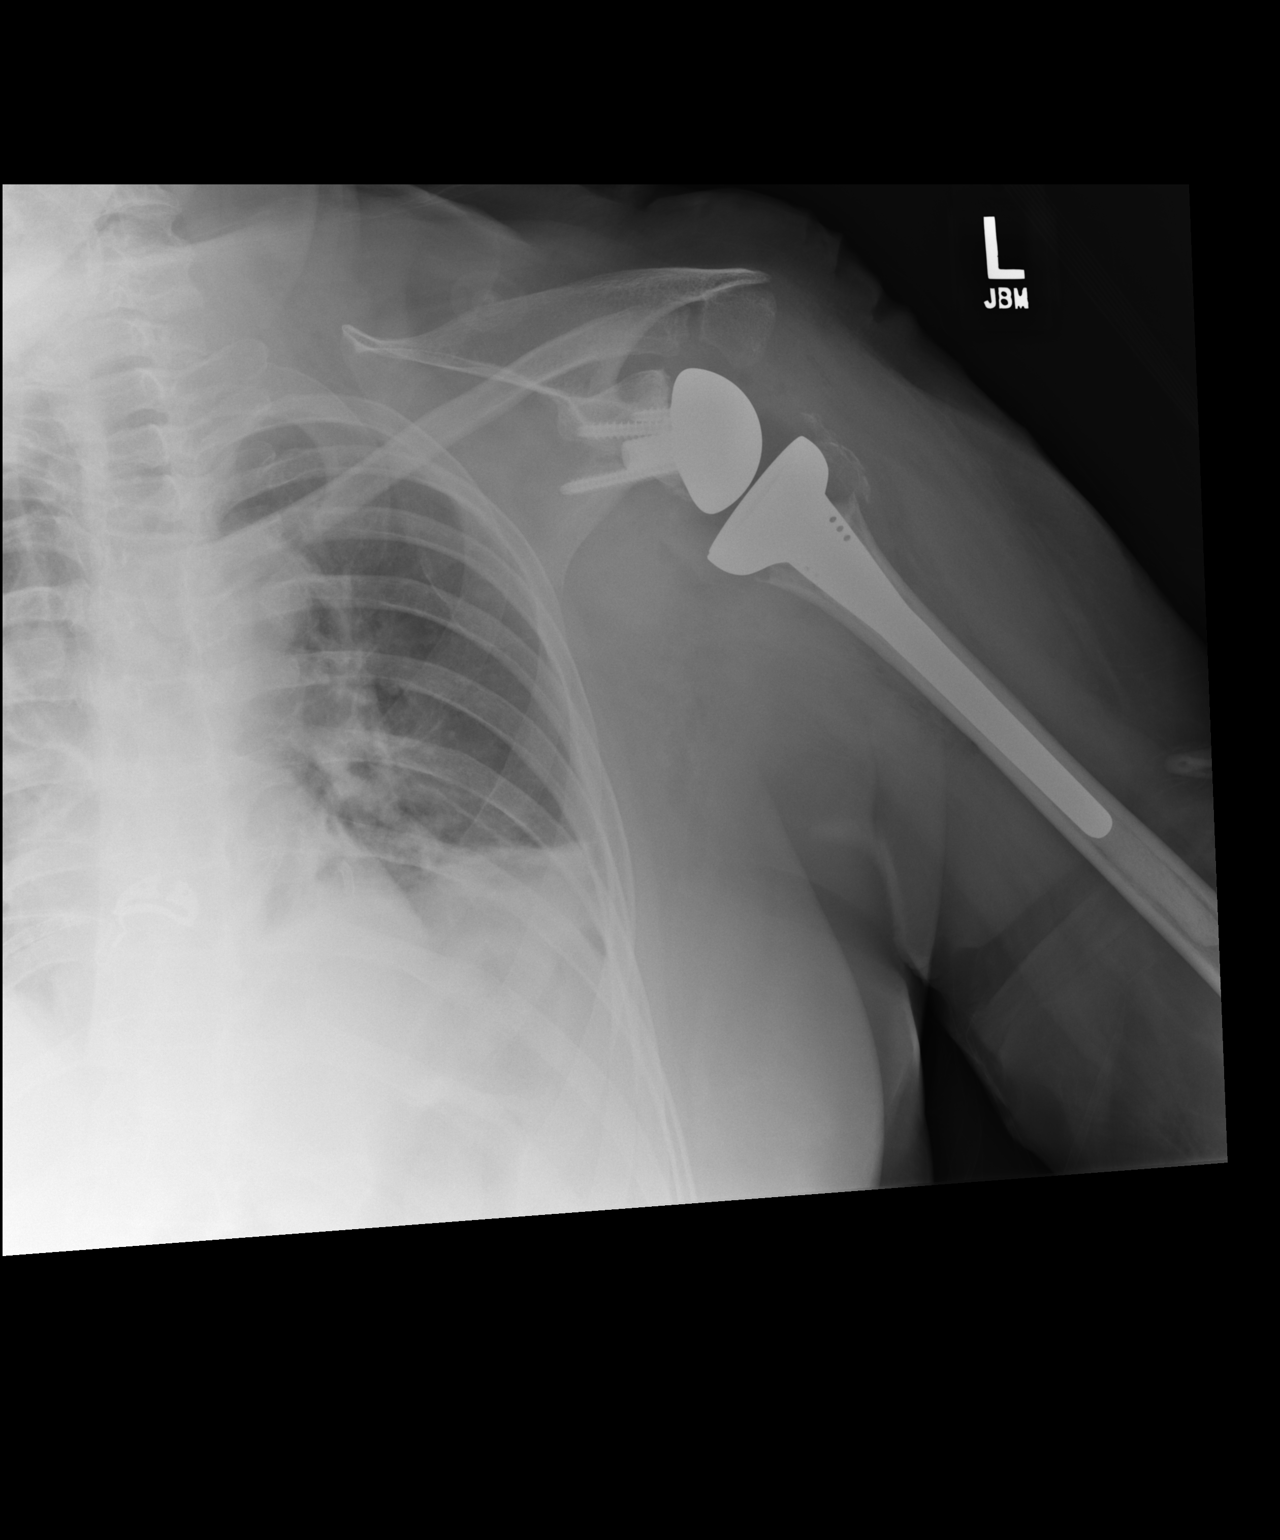

[AP (2 of 2)]
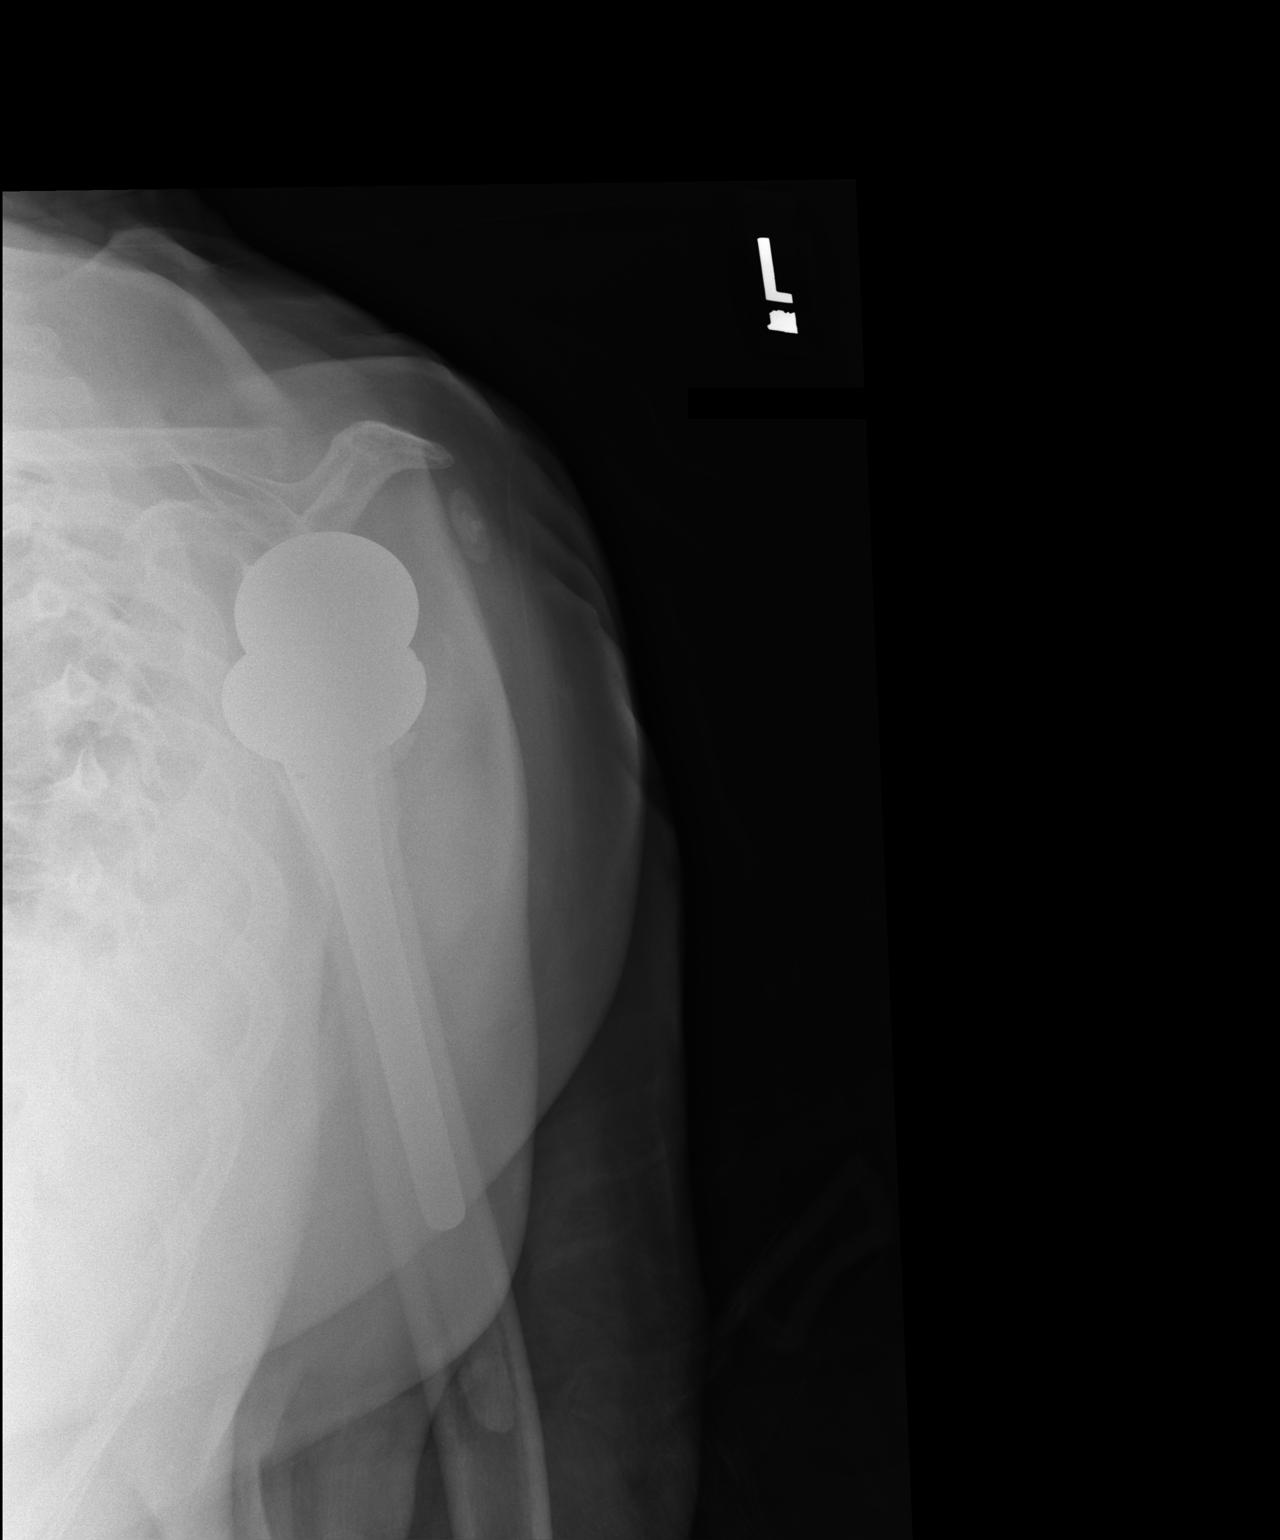

[2 of 2 positions shown; findings below may reference images not displayed]

FINDINGS: Reverse total left shoulder arthroplasty in expected alignment.
Humeral shaft is midline. Fragmentation about the lateral aspect of
the humeral prosthesis may be secondary to humeral fracture as seen
on radiographs from 07/10/2015. There is an os acromial.
IMPRESSION: Reverse total left shoulder arthroplasty without immediate
postoperative complication.
